# Patient Record
Sex: Male | Born: 1960 | Race: Black or African American | Hispanic: No | Marital: Married | Smoking: Never smoker
Health system: Southern US, Community
[De-identification: ages and names within clinical notes are randomized; demographics above are authoritative.]

## PROBLEM LIST (undated history)

## (undated) DIAGNOSIS — M5126 Other intervertebral disc displacement, lumbar region: Secondary | ICD-10-CM

## (undated) DIAGNOSIS — R112 Nausea with vomiting, unspecified: Secondary | ICD-10-CM

## (undated) DIAGNOSIS — E7800 Pure hypercholesterolemia, unspecified: Secondary | ICD-10-CM

## (undated) DIAGNOSIS — Z9889 Other specified postprocedural states: Secondary | ICD-10-CM

## (undated) DIAGNOSIS — K219 Gastro-esophageal reflux disease without esophagitis: Secondary | ICD-10-CM

## (undated) HISTORY — PX: COLONOSCOPY: SHX174

## (undated) HISTORY — PX: ROTATOR CUFF REPAIR: SHX139

## (undated) HISTORY — PX: BACK SURGERY: SHX140

---

## 1998-12-16 ENCOUNTER — Encounter: Payer: Self-pay | Admitting: Neurosurgery

## 1998-12-16 ENCOUNTER — Ambulatory Visit (HOSPITAL_COMMUNITY): Admission: RE | Admit: 1998-12-16 | Discharge: 1998-12-16 | Payer: Self-pay | Admitting: Neurosurgery

## 1999-09-23 ENCOUNTER — Ambulatory Visit (HOSPITAL_COMMUNITY): Admission: RE | Admit: 1999-09-23 | Discharge: 1999-09-23 | Payer: Self-pay | Admitting: Neurosurgery

## 1999-09-23 ENCOUNTER — Encounter: Payer: Self-pay | Admitting: Neurosurgery

## 2011-04-05 DIAGNOSIS — R072 Precordial pain: Secondary | ICD-10-CM

## 2016-11-28 ENCOUNTER — Other Ambulatory Visit: Payer: Self-pay | Admitting: Neurosurgery

## 2016-12-17 ENCOUNTER — Other Ambulatory Visit: Payer: Self-pay

## 2016-12-17 ENCOUNTER — Encounter (HOSPITAL_COMMUNITY): Payer: Self-pay | Admitting: *Deleted

## 2016-12-17 NOTE — Progress Notes (Signed)
Pt denies SOB, chest pain, and being under the care of a cardiologist. Pt stated that a stress test ws performed > 10 years ago. Pt denies having an echo and cardiac cath. Pt denies having a chest x ray and EKG within the last year. Pt denies recent labs. Pt made aware to stop taking Aspirin, vitamins, fish oil and herbal medications. Do not take any NSAIDs ie: Ibuprofen, Advil, Naproxen (ALeve), Motrin, BC and Goody Powder or any medication containing Aspirin. Pt verbalized understanding of all pre-op instructions.

## 2016-12-18 ENCOUNTER — Encounter (HOSPITAL_COMMUNITY)
Admission: RE | Disposition: A | Discharge status: Home / Self Care | Payer: Self-pay | Source: Ambulatory Visit | Attending: Neurosurgery

## 2016-12-18 ENCOUNTER — Encounter (HOSPITAL_COMMUNITY): Payer: Self-pay | Admitting: *Deleted

## 2016-12-18 ENCOUNTER — Inpatient Hospital Stay (HOSPITAL_COMMUNITY): Payer: Worker's Compensation

## 2016-12-18 ENCOUNTER — Inpatient Hospital Stay (HOSPITAL_COMMUNITY): Payer: Worker's Compensation | Admitting: Anesthesiology

## 2016-12-18 ENCOUNTER — Inpatient Hospital Stay (HOSPITAL_COMMUNITY)
Admission: RE | Admit: 2016-12-18 | Discharge: 2016-12-20 | DRG: 455 | Disposition: A | Discharge status: Home / Self Care | Payer: Worker's Compensation | Source: Ambulatory Visit | Attending: Neurosurgery | Admitting: Neurosurgery

## 2016-12-18 DIAGNOSIS — K219 Gastro-esophageal reflux disease without esophagitis: Secondary | ICD-10-CM | POA: Diagnosis present

## 2016-12-18 DIAGNOSIS — M5116 Intervertebral disc disorders with radiculopathy, lumbar region: Secondary | ICD-10-CM | POA: Diagnosis present

## 2016-12-18 DIAGNOSIS — E7800 Pure hypercholesterolemia, unspecified: Secondary | ICD-10-CM | POA: Diagnosis present

## 2016-12-18 DIAGNOSIS — Z79899 Other long term (current) drug therapy: Secondary | ICD-10-CM

## 2016-12-18 DIAGNOSIS — Z419 Encounter for procedure for purposes other than remedying health state, unspecified: Secondary | ICD-10-CM

## 2016-12-18 DIAGNOSIS — M5126 Other intervertebral disc displacement, lumbar region: Secondary | ICD-10-CM | POA: Diagnosis present

## 2016-12-18 HISTORY — DX: Nausea with vomiting, unspecified: Z98.890

## 2016-12-18 HISTORY — DX: Gastro-esophageal reflux disease without esophagitis: K21.9

## 2016-12-18 HISTORY — DX: Other specified postprocedural states: R11.2

## 2016-12-18 HISTORY — DX: Other intervertebral disc displacement, lumbar region: M51.26

## 2016-12-18 HISTORY — DX: Pure hypercholesterolemia, unspecified: E78.00

## 2016-12-18 LAB — CBC
HEMATOCRIT: 42 % (ref 39.0–52.0)
HEMOGLOBIN: 14.6 g/dL (ref 13.0–17.0)
MCH: 28.1 pg (ref 26.0–34.0)
MCHC: 34.8 g/dL (ref 30.0–36.0)
MCV: 80.9 fL (ref 78.0–100.0)
Platelets: 189 10*3/uL (ref 150–400)
RBC: 5.19 MIL/uL (ref 4.22–5.81)
RDW: 13 % (ref 11.5–15.5)
WBC: 3.7 10*3/uL — ABNORMAL LOW (ref 4.0–10.5)

## 2016-12-18 LAB — COMPREHENSIVE METABOLIC PANEL
ALT: 25 U/L (ref 17–63)
AST: 24 U/L (ref 15–41)
Albumin: 3.7 g/dL (ref 3.5–5.0)
Alkaline Phosphatase: 111 U/L (ref 38–126)
Anion gap: 10 (ref 5–15)
BUN: 9 mg/dL (ref 6–20)
CHLORIDE: 103 mmol/L (ref 101–111)
CO2: 24 mmol/L (ref 22–32)
CREATININE: 0.88 mg/dL (ref 0.61–1.24)
Calcium: 8.9 mg/dL (ref 8.9–10.3)
GFR calc Af Amer: >60 mL/min (ref >60)
Glucose, Bld: 116 mg/dL — ABNORMAL HIGH (ref 65–99)
Potassium: 3.5 mmol/L (ref 3.5–5.1)
SODIUM: 137 mmol/L (ref 135–145)
Total Bilirubin: 1 mg/dL (ref 0.3–1.2)
Total Protein: 7.2 g/dL (ref 6.5–8.1)

## 2016-12-18 LAB — SURGICAL PCR SCREEN
MRSA, PCR: NEGATIVE
STAPHYLOCOCCUS AUREUS: NEGATIVE

## 2016-12-18 LAB — ABO/RH: ABO/RH(D): O POS

## 2016-12-18 LAB — TYPE AND SCREEN
ABO/RH(D): O POS
Antibody Screen: NEGATIVE

## 2016-12-18 SURGERY — POSTERIOR LUMBAR FUSION 1 LEVEL
Anesthesia: General | Site: Spine Lumbar

## 2016-12-18 MED ORDER — LIDOCAINE 2% (20 MG/ML) 5 ML SYRINGE
INTRAMUSCULAR | Status: AC
  Filled 2016-12-18: qty 5

## 2016-12-18 MED ORDER — SUGAMMADEX SODIUM 200 MG/2ML IV SOLN
INTRAVENOUS | Status: DC | PRN
  Administered 2016-12-18: 200 mg via INTRAVENOUS

## 2016-12-18 MED ORDER — EPHEDRINE 5 MG/ML INJ
INTRAVENOUS | Status: AC
  Filled 2016-12-18: qty 10

## 2016-12-18 MED ORDER — BACITRACIN ZINC 500 UNIT/GM EX OINT
TOPICAL_OINTMENT | CUTANEOUS | Status: AC
  Filled 2016-12-18: qty 28.35

## 2016-12-18 MED ORDER — MIDAZOLAM HCL 5 MG/5ML IJ SOLN
INTRAMUSCULAR | Status: DC | PRN
  Administered 2016-12-18: 2 mg via INTRAVENOUS

## 2016-12-18 MED ORDER — LIDOCAINE 2% (20 MG/ML) 5 ML SYRINGE
INTRAMUSCULAR | Status: DC | PRN
  Administered 2016-12-18: 60 mg via INTRAVENOUS

## 2016-12-18 MED ORDER — VANCOMYCIN HCL 1000 MG IV SOLR
INTRAVENOUS | Status: AC
  Filled 2016-12-18: qty 1000

## 2016-12-18 MED ORDER — HEMOSTATIC AGENTS (NO CHARGE) OPTIME
TOPICAL | Status: DC | PRN
  Administered 2016-12-18: 1 via TOPICAL

## 2016-12-18 MED ORDER — MENTHOL 3 MG MT LOZG: 1.0000 | LOZENGE | OROMUCOSAL | Status: DC | PRN

## 2016-12-18 MED ORDER — ONDANSETRON HCL 4 MG/2ML IJ SOLN
INTRAMUSCULAR | Status: AC
  Filled 2016-12-18: qty 2

## 2016-12-18 MED ORDER — ONDANSETRON HCL 4 MG/2ML IJ SOLN
INTRAMUSCULAR | Status: DC | PRN
  Administered 2016-12-18: 4 mg via INTRAVENOUS

## 2016-12-18 MED ORDER — ALBUMIN HUMAN 5 % IV SOLN
INTRAVENOUS | Status: DC | PRN
  Administered 2016-12-18: 11:00:00 via INTRAVENOUS

## 2016-12-18 MED ORDER — ARTIFICIAL TEARS OPHTHALMIC OINT
TOPICAL_OINTMENT | OPHTHALMIC | Status: DC | PRN
  Administered 2016-12-18: 1 via OPHTHALMIC

## 2016-12-18 MED ORDER — ACETAMINOPHEN 325 MG PO TABS
650.0000 mg | ORAL_TABLET | ORAL | Status: DC | PRN
  Administered 2016-12-18: 650 mg via ORAL
  Filled 2016-12-18: qty 2

## 2016-12-18 MED ORDER — EPHEDRINE SULFATE 50 MG/ML IJ SOLN
INTRAMUSCULAR | Status: DC | PRN
  Administered 2016-12-18: 10 mg via INTRAVENOUS
  Administered 2016-12-18 (×3): 5 mg via INTRAVENOUS

## 2016-12-18 MED ORDER — OXYCODONE HCL 5 MG PO TABS
10.0000 mg | ORAL_TABLET | ORAL | Status: DC | PRN
  Administered 2016-12-18 (×2): 10 mg via ORAL
  Filled 2016-12-18 (×2): qty 2

## 2016-12-18 MED ORDER — DOCUSATE SODIUM 100 MG PO CAPS
100.0000 mg | ORAL_CAPSULE | Freq: Two times a day (BID) | ORAL | Status: DC
  Administered 2016-12-18 – 2016-12-20 (×4): 100 mg via ORAL
  Filled 2016-12-18 (×4): qty 1

## 2016-12-18 MED ORDER — ROCURONIUM BROMIDE 10 MG/ML (PF) SYRINGE
PREFILLED_SYRINGE | INTRAVENOUS | Status: AC
  Filled 2016-12-18: qty 5

## 2016-12-18 MED ORDER — THROMBIN (RECOMBINANT) 20000 UNITS EX SOLR
CUTANEOUS | Status: AC
  Filled 2016-12-18: qty 20000

## 2016-12-18 MED ORDER — VANCOMYCIN HCL 1000 MG IV SOLR
INTRAVENOUS | Status: DC | PRN
  Administered 2016-12-18: 1000 mg via TOPICAL

## 2016-12-18 MED ORDER — ONDANSETRON HCL 4 MG/2ML IJ SOLN: 4.0000 mg | Freq: Four times a day (QID) | INTRAMUSCULAR | Status: DC | PRN

## 2016-12-18 MED ORDER — ACETAMINOPHEN 650 MG RE SUPP: 650.0000 mg | RECTAL | Status: DC | PRN

## 2016-12-18 MED ORDER — PHENYLEPHRINE 40 MCG/ML (10ML) SYRINGE FOR IV PUSH (FOR BLOOD PRESSURE SUPPORT)
PREFILLED_SYRINGE | INTRAVENOUS | Status: AC
  Filled 2016-12-18: qty 10

## 2016-12-18 MED ORDER — CYCLOBENZAPRINE HCL 10 MG PO TABS
10.0000 mg | ORAL_TABLET | Freq: Three times a day (TID) | ORAL | Status: DC | PRN
  Administered 2016-12-18 – 2016-12-20 (×2): 10 mg via ORAL
  Filled 2016-12-18 (×2): qty 1

## 2016-12-18 MED ORDER — 0.9 % SODIUM CHLORIDE (POUR BTL) OPTIME
TOPICAL | Status: DC | PRN
  Administered 2016-12-18: 1000 mL

## 2016-12-18 MED ORDER — MORPHINE SULFATE (PF) 4 MG/ML IV SOLN
4.0000 mg | INTRAVENOUS | Status: DC | PRN
  Administered 2016-12-18: 4 mg via INTRAVENOUS
  Filled 2016-12-18: qty 1

## 2016-12-18 MED ORDER — BUPIVACAINE LIPOSOME 1.3 % IJ SUSP
20.0000 mL | Freq: Once | INTRAMUSCULAR | Status: AC
  Administered 2016-12-18: 20 mL
  Filled 2016-12-18: qty 20

## 2016-12-18 MED ORDER — BACITRACIN ZINC 500 UNIT/GM EX OINT
TOPICAL_OINTMENT | CUTANEOUS | Status: DC | PRN
  Administered 2016-12-18: 1 via TOPICAL

## 2016-12-18 MED ORDER — CHLORHEXIDINE GLUCONATE CLOTH 2 % EX PADS: 6.0000 | MEDICATED_PAD | Freq: Once | CUTANEOUS | Status: DC

## 2016-12-18 MED ORDER — BUPIVACAINE-EPINEPHRINE (PF) 0.5% -1:200000 IJ SOLN
INTRAMUSCULAR | Status: DC | PRN
  Administered 2016-12-18: 10 mL

## 2016-12-18 MED ORDER — DEXAMETHASONE SODIUM PHOSPHATE 4 MG/ML IJ SOLN
INTRAMUSCULAR | Status: DC | PRN
  Administered 2016-12-18: 10 mg via INTRAVENOUS

## 2016-12-18 MED ORDER — SODIUM CHLORIDE 0.9 % IV SOLN: INTRAVENOUS | Status: DC

## 2016-12-18 MED ORDER — PROPOFOL 10 MG/ML IV BOLUS
INTRAVENOUS | Status: AC
  Filled 2016-12-18: qty 20

## 2016-12-18 MED ORDER — BISACODYL 10 MG RE SUPP
10.0000 mg | Freq: Every day | RECTAL | Status: DC | PRN
Start: 2016-12-18 — End: 2016-12-20

## 2016-12-18 MED ORDER — ONDANSETRON HCL 4 MG PO TABS: 4.0000 mg | ORAL_TABLET | Freq: Four times a day (QID) | ORAL | Status: DC | PRN

## 2016-12-18 MED ORDER — FENTANYL CITRATE (PF) 250 MCG/5ML IJ SOLN
INTRAMUSCULAR | Status: AC
  Filled 2016-12-18: qty 5

## 2016-12-18 MED ORDER — DEXAMETHASONE SODIUM PHOSPHATE 10 MG/ML IJ SOLN
INTRAMUSCULAR | Status: AC
  Filled 2016-12-18: qty 1

## 2016-12-18 MED ORDER — OXYCODONE HCL 5 MG/5ML PO SOLN: 5.0000 mg | Freq: Once | ORAL | Status: DC | PRN

## 2016-12-18 MED ORDER — SODIUM CHLORIDE 0.9% FLUSH: 3.0000 mL | INTRAVENOUS | Status: DC | PRN

## 2016-12-18 MED ORDER — POLYVINYL ALCOHOL 1.4 % OP SOLN
1.0000 [drp] | OPHTHALMIC | Status: DC | PRN
  Filled 2016-12-18: qty 15

## 2016-12-18 MED ORDER — EZETIMIBE 10 MG PO TABS
10.0000 mg | ORAL_TABLET | Freq: Every day | ORAL | Status: DC
  Administered 2016-12-19 – 2016-12-20 (×2): 10 mg via ORAL
  Filled 2016-12-18 (×3): qty 1

## 2016-12-18 MED ORDER — HYDROMORPHONE HCL 1 MG/ML IJ SOLN
0.2500 mg | INTRAMUSCULAR | Status: DC | PRN
  Administered 2016-12-18: 0.5 mg via INTRAVENOUS

## 2016-12-18 MED ORDER — ARTIFICIAL TEARS OPHTHALMIC OINT
TOPICAL_OINTMENT | OPHTHALMIC | Status: AC
  Filled 2016-12-18: qty 3.5

## 2016-12-18 MED ORDER — THROMBIN (RECOMBINANT) 5000 UNITS EX SOLR
CUTANEOUS | Status: AC
  Filled 2016-12-18: qty 5000

## 2016-12-18 MED ORDER — OXYCODONE HCL 5 MG PO TABS: 5.0000 mg | ORAL_TABLET | ORAL | Status: DC | PRN

## 2016-12-18 MED ORDER — PROPOFOL 10 MG/ML IV BOLUS
INTRAVENOUS | Status: DC | PRN
  Administered 2016-12-18: 150 mg via INTRAVENOUS

## 2016-12-18 MED ORDER — MIDAZOLAM HCL 2 MG/2ML IJ SOLN
INTRAMUSCULAR | Status: AC
  Filled 2016-12-18: qty 2

## 2016-12-18 MED ORDER — ROCURONIUM BROMIDE 10 MG/ML (PF) SYRINGE
PREFILLED_SYRINGE | INTRAVENOUS | Status: DC | PRN
  Administered 2016-12-18: 10 mg via INTRAVENOUS
  Administered 2016-12-18: 30 mg via INTRAVENOUS
  Administered 2016-12-18: 50 mg via INTRAVENOUS
  Administered 2016-12-18: 20 mg via INTRAVENOUS

## 2016-12-18 MED ORDER — PHENYLEPHRINE HCL 10 MG/ML IJ SOLN
INTRAVENOUS | Status: DC | PRN
  Administered 2016-12-18: 15 ug/min via INTRAVENOUS

## 2016-12-18 MED ORDER — LACTATED RINGERS IV SOLN
INTRAVENOUS | Status: DC | PRN
  Administered 2016-12-18 (×2): via INTRAVENOUS

## 2016-12-18 MED ORDER — PHENOL 1.4 % MT LIQD: 1.0000 | OROMUCOSAL | Status: DC | PRN

## 2016-12-18 MED ORDER — THROMBIN (RECOMBINANT) 5000 UNITS EX SOLR
CUTANEOUS | Status: DC | PRN
  Administered 2016-12-18: 5000 [IU] via TOPICAL

## 2016-12-18 MED ORDER — FENTANYL CITRATE (PF) 100 MCG/2ML IJ SOLN
INTRAMUSCULAR | Status: DC | PRN
  Administered 2016-12-18: 25 ug via INTRAVENOUS
  Administered 2016-12-18: 50 ug via INTRAVENOUS
  Administered 2016-12-18 (×3): 25 ug via INTRAVENOUS
  Administered 2016-12-18: 150 ug via INTRAVENOUS

## 2016-12-18 MED ORDER — SODIUM CHLORIDE 0.9% FLUSH: 3.0000 mL | Freq: Two times a day (BID) | INTRAVENOUS | Status: DC

## 2016-12-18 MED ORDER — BUPIVACAINE-EPINEPHRINE (PF) 0.5% -1:200000 IJ SOLN
INTRAMUSCULAR | Status: AC
  Filled 2016-12-18: qty 30

## 2016-12-18 MED ORDER — OXYCODONE HCL 5 MG PO TABS: 5.0000 mg | ORAL_TABLET | Freq: Once | ORAL | Status: DC | PRN

## 2016-12-18 MED ORDER — CEFAZOLIN SODIUM-DEXTROSE 2-4 GM/100ML-% IV SOLN
2.0000 g | Freq: Three times a day (TID) | INTRAVENOUS | Status: AC
  Administered 2016-12-18 (×2): 2 g via INTRAVENOUS
  Filled 2016-12-18 (×2): qty 100

## 2016-12-18 MED ORDER — HYDROMORPHONE HCL 1 MG/ML IJ SOLN
INTRAMUSCULAR | Status: AC
  Filled 2016-12-18: qty 1

## 2016-12-18 MED ORDER — BACITRACIN 50000 UNITS IM SOLR
INTRAMUSCULAR | Status: DC | PRN
  Administered 2016-12-18: 500 mL

## 2016-12-18 MED ORDER — SUGAMMADEX SODIUM 200 MG/2ML IV SOLN
INTRAVENOUS | Status: AC
  Filled 2016-12-18: qty 2

## 2016-12-18 MED ORDER — FAMOTIDINE 20 MG PO TABS
20.0000 mg | ORAL_TABLET | Freq: Two times a day (BID) | ORAL | Status: DC
  Administered 2016-12-18 – 2016-12-20 (×4): 20 mg via ORAL
  Filled 2016-12-18 (×4): qty 1

## 2016-12-18 MED ORDER — CEFAZOLIN SODIUM-DEXTROSE 2-4 GM/100ML-% IV SOLN
2.0000 g | INTRAVENOUS | Status: AC
  Administered 2016-12-18: 2 g via INTRAVENOUS
  Filled 2016-12-18: qty 100

## 2016-12-18 MED ORDER — HYDROXYZINE HCL 50 MG/ML IM SOLN
50.0000 mg | Freq: Four times a day (QID) | INTRAMUSCULAR | Status: DC | PRN
  Administered 2016-12-18 – 2016-12-20 (×3): 50 mg via INTRAMUSCULAR
  Filled 2016-12-18 (×3): qty 1

## 2016-12-18 MED ORDER — PHENYLEPHRINE 40 MCG/ML (10ML) SYRINGE FOR IV PUSH (FOR BLOOD PRESSURE SUPPORT)
PREFILLED_SYRINGE | INTRAVENOUS | Status: DC | PRN
  Administered 2016-12-18 (×3): 80 ug via INTRAVENOUS
  Administered 2016-12-18 (×2): 40 ug via INTRAVENOUS
  Administered 2016-12-18: 80 ug via INTRAVENOUS
  Administered 2016-12-18: 40 ug via INTRAVENOUS

## 2016-12-18 SURGICAL SUPPLY — 73 items
APL SKNCLS STERI-STRIP NONHPOA (GAUZE/BANDAGES/DRESSINGS) ×1
BAG DECANTER FOR FLEXI CONT (MISCELLANEOUS) ×3 IMPLANT
BENZOIN TINCTURE PRP APPL 2/3 (GAUZE/BANDAGES/DRESSINGS) ×3 IMPLANT
BLADE CLIPPER SURG (BLADE) ×2 IMPLANT
BUR MATCHSTICK NEURO 3.0 LAGG (BURR) ×3 IMPLANT
BUR PRECISION FLUTE 6.0 (BURR) ×3 IMPLANT
CAGE ALTERA 10X31MM-10-14-15 (Cage) ×1 IMPLANT
CAGE ALTERA 10X31X10-14 15D (Cage) ×1 IMPLANT
CANISTER SUCT 3000ML PPV (MISCELLANEOUS) ×3 IMPLANT
CAP REVERE LOCKING (Cap) ×8 IMPLANT
CARTRIDGE OIL MAESTRO DRILL (MISCELLANEOUS) ×1 IMPLANT
CLOSURE WOUND 1/2 X4 (GAUZE/BANDAGES/DRESSINGS) ×1
CONT SPEC 4OZ CLIKSEAL STRL BL (MISCELLANEOUS) ×3 IMPLANT
COVER BACK TABLE 60X90IN (DRAPES) ×4 IMPLANT
DIFFUSER DRILL AIR PNEUMATIC (MISCELLANEOUS) ×3 IMPLANT
DRAPE C-ARM 42X72 X-RAY (DRAPES) ×6 IMPLANT
DRAPE HALF SHEET 40X57 (DRAPES) ×3 IMPLANT
DRAPE LAPAROTOMY 100X72X124 (DRAPES) ×3 IMPLANT
DRAPE POUCH INSTRU U-SHP 10X18 (DRAPES) ×3 IMPLANT
DRAPE SHEET LG 3/4 BI-LAMINATE (DRAPES) ×2 IMPLANT
DRAPE SURG 17X23 STRL (DRAPES) ×12 IMPLANT
ELECT BLADE 4.0 EZ CLEAN MEGAD (MISCELLANEOUS) ×3
ELECT REM PT RETURN 9FT ADLT (ELECTROSURGICAL) ×3
ELECTRODE BLDE 4.0 EZ CLN MEGD (MISCELLANEOUS) ×1 IMPLANT
ELECTRODE REM PT RTRN 9FT ADLT (ELECTROSURGICAL) ×1 IMPLANT
EVACUATOR 1/8 PVC DRAIN (DRAIN) IMPLANT
GAUZE SPONGE 4X4 12PLY STRL (GAUZE/BANDAGES/DRESSINGS) ×1 IMPLANT
GAUZE SPONGE 4X4 12PLY STRL LF (GAUZE/BANDAGES/DRESSINGS) ×2 IMPLANT
GAUZE SPONGE 4X4 16PLY XRAY LF (GAUZE/BANDAGES/DRESSINGS) ×1 IMPLANT
GLOVE BIO SURGEON STRL SZ7 (GLOVE) ×6 IMPLANT
GLOVE BIO SURGEON STRL SZ8 (GLOVE) ×8 IMPLANT
GLOVE BIO SURGEON STRL SZ8.5 (GLOVE) ×6 IMPLANT
GLOVE BIOGEL PI IND STRL 6.5 (GLOVE) IMPLANT
GLOVE BIOGEL PI IND STRL 7.0 (GLOVE) IMPLANT
GLOVE BIOGEL PI IND STRL 7.5 (GLOVE) IMPLANT
GLOVE BIOGEL PI INDICATOR 6.5 (GLOVE) ×6
GLOVE BIOGEL PI INDICATOR 7.0 (GLOVE) ×4
GLOVE BIOGEL PI INDICATOR 7.5 (GLOVE) ×2
GLOVE EXAM NITRILE LRG STRL (GLOVE) IMPLANT
GLOVE EXAM NITRILE XL STR (GLOVE) IMPLANT
GLOVE EXAM NITRILE XS STR PU (GLOVE) IMPLANT
GLOVE SURG SS PI 6.5 STRL IVOR (GLOVE) ×8 IMPLANT
GOWN STRL REUS W/ TWL LRG LVL3 (GOWN DISPOSABLE) IMPLANT
GOWN STRL REUS W/ TWL XL LVL3 (GOWN DISPOSABLE) ×2 IMPLANT
GOWN STRL REUS W/TWL 2XL LVL3 (GOWN DISPOSABLE) IMPLANT
GOWN STRL REUS W/TWL LRG LVL3 (GOWN DISPOSABLE) ×12
GOWN STRL REUS W/TWL XL LVL3 (GOWN DISPOSABLE) ×9
KIT BASIN OR (CUSTOM PROCEDURE TRAY) ×3 IMPLANT
KIT ROOM TURNOVER OR (KITS) ×3 IMPLANT
NDL HYPO 21X1.5 SAFETY (NEEDLE) IMPLANT
NEEDLE HYPO 21X1.5 SAFETY (NEEDLE) ×3 IMPLANT
NEEDLE HYPO 22GX1.5 SAFETY (NEEDLE) ×3 IMPLANT
NS IRRIG 1000ML POUR BTL (IV SOLUTION) ×3 IMPLANT
OIL CARTRIDGE MAESTRO DRILL (MISCELLANEOUS) ×3
PACK LAMINECTOMY NEURO (CUSTOM PROCEDURE TRAY) ×3 IMPLANT
PAD ARMBOARD 7.5X6 YLW CONV (MISCELLANEOUS) ×13 IMPLANT
PATTIES SURGICAL .5 X1 (DISPOSABLE) IMPLANT
ROD REVERE 6.35 40MM (Rod) ×4 IMPLANT
SCREW REVERE 6.35 6.5MMX45 (Screw) ×8 IMPLANT
SPONGE LAP 4X18 X RAY DECT (DISPOSABLE) IMPLANT
SPONGE NEURO XRAY DETECT 1X3 (DISPOSABLE) IMPLANT
SPONGE SURGIFOAM ABS GEL 100 (HEMOSTASIS) ×1 IMPLANT
STRIP BIOACTIVE 20CC 25X100X8 (Miscellaneous) ×2 IMPLANT
STRIP CLOSURE SKIN 1/2X4 (GAUZE/BANDAGES/DRESSINGS) ×2 IMPLANT
SUT PROLENE 6 0 BV (SUTURE) ×2 IMPLANT
SUT VIC AB 1 CT1 18XBRD ANBCTR (SUTURE) ×2 IMPLANT
SUT VIC AB 1 CT1 8-18 (SUTURE) ×6
SUT VIC AB 2-0 CP2 18 (SUTURE) ×6 IMPLANT
TAPE CLOTH SURG 4X10 WHT LF (GAUZE/BANDAGES/DRESSINGS) ×2 IMPLANT
TOWEL GREEN STERILE (TOWEL DISPOSABLE) ×3 IMPLANT
TOWEL GREEN STERILE FF (TOWEL DISPOSABLE) ×3 IMPLANT
TRAY FOLEY W/METER SILVER 16FR (SET/KITS/TRAYS/PACK) ×3 IMPLANT
WATER STERILE IRR 1000ML POUR (IV SOLUTION) ×3 IMPLANT

## 2016-12-18 NOTE — Transfer of Care (Signed)
Immediate Anesthesia Transfer of Care Note  Patient: Gregory Maxwell.  Procedure(s) Performed: POSTERIOR LUMBAR INTERBODY FUSION, INTERBODY PROSTHESIS, POSTERIOR INSTRUMENTATION LUMBAR FOUR- LUMBAR FIVE (N/A Spine Lumbar)  Patient Location: PACU  Anesthesia Type:General  Level of Consciousness: awake, alert  and oriented  Airway & Oxygen Therapy: Patient Spontanous Breathing and Patient connected to face mask oxygen  Post-op Assessment: Report given to RN, Post -op Vital signs reviewed and stable and Patient moving all extremities X 4  Post vital signs: Reviewed and stable  Last Vitals:  Vitals:   12/18/16 0643  BP: 137/84  Pulse: 73  Resp: 20  Temp: 36.9 C  SpO2: 98%    Last Pain:  Vitals:   12/18/16 0643  TempSrc: Oral      Patients Stated Pain Goal: 4 (13/68/59 9234)  Complications: No apparent anesthesia complications

## 2016-12-18 NOTE — Anesthesia Procedure Notes (Addendum)
Procedure Name: Intubation Date/Time: 12/18/2016 8:40 AM Performed by: Harden Mo, CRNA Pre-anesthesia Checklist: Patient identified, Emergency Drugs available, Suction available, Patient being monitored and Timeout performed Patient Re-evaluated:Patient Re-evaluated prior to induction Oxygen Delivery Method: Circle system utilized Preoxygenation: Pre-oxygenation with 100% oxygen Induction Type: IV induction Ventilation: Mask ventilation without difficulty Laryngoscope Size: Mac and 4 Grade View: Grade II Tube type: Oral Tube size: 7.5 mm Number of attempts: 1 Airway Equipment and Method: Stylet Placement Confirmation: ETT inserted through vocal cords under direct vision,  positive ETCO2 and breath sounds checked- equal and bilateral Secured at: 22 cm Tube secured with: Tape Dental Injury: Teeth and Oropharynx as per pre-operative assessment  Comments: Intubation by Chrissie Noa

## 2016-12-18 NOTE — Anesthesia Preprocedure Evaluation (Addendum)
Anesthesia Evaluation  Patient identified by MRN, date of birth, ID band Patient awake    Reviewed: Allergy & Precautions, H&P , NPO status , Patient's Chart, lab work & pertinent test results  History of Anesthesia Complications (+) PONV and history of anesthetic complications  Airway Mallampati: II  TM Distance: >3 FB Neck ROM: full    Dental  (+) Teeth Intact, Dental Advisory Given   Pulmonary neg pulmonary ROS,    breath sounds clear to auscultation       Cardiovascular negative cardio ROS   Rhythm:regular Rate:Normal     Neuro/Psych    GI/Hepatic GERD  ,  Endo/Other    Renal/GU      Musculoskeletal   Abdominal   Peds  Hematology   Anesthesia Other Findings   Reproductive/Obstetrics                            Anesthesia Physical Anesthesia Plan  ASA: II  Anesthesia Plan: General   Post-op Pain Management:    Induction: Intravenous  PONV Risk Score and Plan: 2 and Treatment may vary due to age or medical condition, Ondansetron, Dexamethasone and Midazolam  Airway Management Planned: Oral ETT  Additional Equipment:   Intra-op Plan:   Post-operative Plan: Extubation in OR  Informed Consent: I have reviewed the patients History and Physical, chart, labs and discussed the procedure including the risks, benefits and alternatives for the proposed anesthesia with the patient or authorized representative who has indicated his/her understanding and acceptance.     Plan Discussed with: CRNA, Anesthesiologist and Surgeon  Anesthesia Plan Comments:         Anesthesia Quick Evaluation

## 2016-12-18 NOTE — Anesthesia Procedure Notes (Deleted)
Performed by: Harden Mo, CRNA

## 2016-12-18 NOTE — Op Note (Signed)
Brief history: The patient is a 56 year old black male who has had prior back surgeries.  He has had a previous L4-5 discectomy on the right after work-related injury.  He initially did well but has developed recurrent back and leg pain.  He failed medical management and was worked up with a lumbar MRI.  This demonstrated a recurrent herniated disc at L4-5.  I discussed the various treatment option with the patient and his wife including surgery.  He has weighed the risks, benefits, and alternatives of surgery and decided proceed with a L4-5 decompression, instrumentation, and fusion.  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, degenerative disc disease, lumbago; lumbar radiculopathy  Postoperative diagnosis: The same  Procedure: Bilateral L4-5 laminotomy/foraminotomies to decompress the bilateral L4 and L5 nerve roots(the work required to do this was in addition to the work required to do the posterior lumbar interbody fusion because of the patient's recurrent ruptured disc); L4-5 transforaminal lumbar interbody fusion with local morselized autograft bone and Kinnex graft extender; insertion of interbody prosthesis at L4-5 (globus peek expandable interbody prosthesis); posterior nonsegmental instrumentation from L4 to L5 with globus titanium pedicle screws and rods; posterior lateral arthrodesis at L4-5 with local morselized autograft bone and Kinnex bone graft extender.  Surgeon: Dr. Earle Gell  Asst.: Dr. Sherley Bounds  Anesthesia: Gen. endotracheal  Estimated blood loss: 200 cc  Drains: None  Complications: None  Description of procedure: The patient was brought to the operating room by the anesthesia team. General endotracheal anesthesia was induced. The patient was turned to the prone position on the Creary frame. The patient's lumbosacral region was then prepared with Betadine scrub and Betadine solution. Sterile drapes were applied.  I then injected the area to be incised  with Marcaine with epinephrine solution. I then used the scalpel to make a linear midline incision over the L4-5 interspace, incising through the old surgical scar. I then used electrocautery to perform a bilateral subperiosteal dissection exposing the spinous process and lamina of L4 and L5 bilaterally. We then obtained intraoperative radiograph to confirm our location. We then inserted the Verstrac retractor to provide exposure.  I began the decompression by using the high speed drill to perform laminotomies at L4-5 bilaterally. We then used the Kerrison punches to widen the laminotomy and removed the ligamentum flavum at L4-5 on the left and to remove the scar tissue at L4-5 on the right.. We used the Kerrison punches to remove the medial facets at L4-5 bilaterally. We performed wide foraminotomies about the bilateral L4 and L5 nerve roots completing the decompression.  We now turned our attention to the posterior lumbar interbody fusion. I used a scalpel to incise the intervertebral disc at L4-5 bilaterally. I then performed a partial intervertebral discectomy at L4-5 bilaterally using the pituitary forceps. We prepared the vertebral endplates at A1-2 bilaterally for the fusion by removing the soft tissues with the curettes. We then used the trial spacers to pick the appropriate sized interbody prosthesis. We prefilled his prosthesis with a combination of local morselized autograft bone that we obtained during the decompression as well as Kinnex bone graft extender. We inserted the prefilled prosthesis into the interspace at L4-5, we then expanded the prosthesis. There was a good snug fit of the prosthesis in the interspace. We then filled and the remainder of the intervertebral disc space with local morselized autograft bone and Kinnex. This completed the posterior lumbar interbody arthrodesis.  We now turned attention to the instrumentation. Under fluoroscopic guidance we cannulated the  bilateral L4 and  L5 pedicles with the bone probe. We then removed the bone probe. We then tapped the pedicle with a 5.5 millimeter tap. We then removed the tap. We probed inside the tapped pedicle with a ball probe to rule out cortical breaches. We then inserted a 6.5 x 45 millimeter pedicle screw into the L4 and L5 pedicles bilaterally under fluoroscopic guidance. We then palpated along the medial aspect of the pedicles to rule out cortical breaches. There were none. The nerve roots were not injured. We then connected the unilateral pedicle screws with a lordotic rod. We compressed the construct and secured the rod in place with the caps. We then tightened the caps appropriately. This completed the instrumentation from L4-5.  We now turned our attention to the posterior lateral arthrodesis at L4-5 bilaterally. We used the high-speed drill to decorticate the remainder of the facets, pars, transverse process at L4-5 bilaterally. We then applied a combination of local morselized autograft bone and Kinnex bone graft extender over these decorticated posterior lateral structures. This completed the posterior lateral arthrodesis.  We then obtained hemostasis using bipolar electrocautery. We irrigated the wound out with bacitracin solution. We inspected the thecal sac and nerve roots and noted they were well decompressed. We then removed the retractor. We placed vancomycin powder in the wound. We reapproximated patient's thoracolumbar fascia with interrupted #1 Vicryl suture. We reapproximated patient's subcutaneous tissue with interrupted 2-0 Vicryl suture. The reapproximated patient's skin with Steri-Strips and benzoin. The wound was then coated with bacitracin ointment. A sterile dressing was applied. The drapes were removed. The patient was subsequently returned to the supine position where they were extubated by the anesthesia team. He was then transported to the post anesthesia care unit in stable condition. All sponge instrument  and needle counts were reportedly correct at the end of this case.

## 2016-12-18 NOTE — H&P (Signed)
Subjective: The patient is a 56 year old black male on whom I performed an L4-5 discectomy.  He initially did well but developed recurrent back and leg pain.  A follow-up lumbar MRI demonstrated a degenerated and recurrent herniated disc at L4-5.  I discussed the various treatment options with the patient.  He has decided to proceed with surgery.  Past Medical History:  Diagnosis Date  . Disc displacement, lumbar   . GERD (gastroesophageal reflux disease)   . Hypercholesterolemia   . PONV (postoperative nausea and vomiting)    problems waking up; problems voiding post-op    Past Surgical History:  Procedure Laterality Date  . BACK SURGERY     total of 2 back surgeries  . COLONOSCOPY    . ROTATOR CUFF REPAIR     right shoulder    No Known Allergies  Social History   Tobacco Use  . Smoking status: Never Smoker  . Smokeless tobacco: Never Used  Substance Use Topics  . Alcohol use: No    Frequency: Never    Family History  Problem Relation Age of Onset  . Hypercholesterolemia Mother   . Congestive Heart Failure Mother   . Renal Disease Sister    Prior to Admission medications   Medication Sig Start Date End Date Taking? Authorizing Provider  amoxicillin (AMOXIL) 875 MG tablet Take 875 mg by mouth 2 (two) times daily.   Yes [provider]  ezetimibe (ZETIA) 10 MG tablet Take 10 mg by mouth daily.   Yes [provider]  ranitidine (ZANTAC) 150 MG tablet Take 150 mg by mouth daily as needed for heartburn.   Yes [provider]     Review of Systems  Positive ROS: As above  All other systems have been reviewed and were otherwise negative with the exception of those mentioned in the HPI and as above.  Objective: Vital signs in last 24 hours: Temp:  [98.4 F (36.9 C)] 98.4 F (36.9 C) (12/05 0643) Pulse Rate:  [73] 73 (12/05 0643) Resp:  [20] 20 (12/05 0643) BP: (137)/(84) 137/84 (12/05 0643) SpO2:  [98 %] 98 % (12/05 0643) Weight:  [80.3  kg (177 lb)] 80.3 kg (177 lb) (12/05 0643)  General Appearance: Alert Head: Normocephalic, without obvious abnormality, atraumatic Eyes: PERRL, conjunctiva/corneas clear, EOM's intact,    Ears: Normal  Throat: Normal  Neck: Supple, his lumbar incision is well-healed. Back: unremarkable Lungs: Clear to auscultation bilaterally, respirations unlabored Heart: Regular rate and rhythm, no murmur, rub or gallop Abdomen: Soft, non-tender Extremities: Extremities normal, atraumatic, no cyanosis or edema Skin: unremarkable  NEUROLOGIC:   Mental status: alert and oriented,Motor Exam - grossly normal Sensory Exam - grossly normal Reflexes:  Coordination - grossly normal Gait - grossly normal Balance - grossly normal Cranial Nerves: I: smell Not tested  II: visual acuity  OS: Normal  OD: Normal   II: visual fields Full to confrontation  II: pupils Equal, round, reactive to light  III,VII: ptosis None  III,IV,VI: extraocular muscles  Full ROM  V: mastication Normal  V: facial light touch sensation  Normal  V,VII: corneal reflex  Present  VII: facial muscle function - upper  Normal  VII: facial muscle function - lower Normal  VIII: hearing Not tested  IX: soft palate elevation  Normal  IX,X: gag reflex Present  XI: trapezius strength  5/5  XI: sternocleidomastoid strength 5/5  XI: neck flexion strength  5/5  XII: tongue strength  Normal    Data Review Lab Results  Component Value Date   WBC 3.7 (L) 12/18/2016   HGB 14.6 12/18/2016   HCT 42.0 12/18/2016   MCV 80.9 12/18/2016   PLT 189 12/18/2016   Lab Results  Component Value Date   NA 137 12/18/2016   K 3.5 12/18/2016   CL 103 12/18/2016   CO2 24 12/18/2016   BUN 9 12/18/2016   CREATININE 0.88 12/18/2016   GLUCOSE 116 (H) 12/18/2016   No results found for: INR, PROTIME  Assessment/Plan: L4-5 degenerative disc disease, recurrent disc, lumbago, lumbar radiculopathy: I have discussed the situation with the patient.  I  have reviewed his imaging studies with him and pointed out the abnormalities.  We have discussed the various treatment options including surgery.  I have described the surgical treatment option of a L4-5 decompression, instrumentation, and fusion.  I have given him a surgical pamphlet.  I have shown him surgical models.  We have discussed the risks, benefits, alternatives, expected postoperative course, and likelihood of achieving our goals with surgery.  I have answered all the patient's questions.  He has decided proceed with surgery.   Ophelia Charter 12/18/2016 8:27 AM

## 2016-12-18 NOTE — Evaluation (Signed)
Physical Therapy Evaluation Patient Details Name: Gregory Maxwell. MRN: [id number] DOB: 1960/05/23 Today's Date: 12/18/2016   History of Present Illness  Pt is a 56 y/o male s/p L4-5 PLIF. PMH includes back surgery and R rotator cuff repair.   Clinical Impression  Pt is s/p surgery above with deficits below. PTA, pt was independent with mobility. Upon eval, pt presenting with post op pain, weakness, and nausea/dizziness. Ambulation distance limited secondary to nausea this session. Required min guard to min A for mobility this session. Reports wife will be able to assist as needed upon d/c home. Anticipate pt will progress well once feeling better and will not need follow up PT services. Will continue to follow acutely to maximize functional mobility independence and safety.     Follow Up Recommendations No PT follow up;Supervision for mobility/OOB    Equipment Recommendations  3in1 (PT)    Recommendations for Other Services       Precautions / Restrictions Precautions Precautions: Back Precaution Booklet Issued: Yes (comment) Precaution Comments: Reviewed back precaution handout with pt.  Required Braces or Orthoses: Spinal Brace Spinal Brace: Lumbar corset;Applied in sitting position Restrictions Weight Bearing Restrictions: No      Mobility  Bed Mobility Overal bed mobility: Needs Assistance Bed Mobility: Rolling;Sidelying to Sit Rolling: Supervision Sidelying to sit: Supervision       General bed mobility comments: Supervision to ensure log roll technique. Cues for sequencing  Transfers Overall transfer level: Needs assistance Equipment used: None Transfers: Sit to/from Stand Sit to Stand: Min assist         General transfer comment: Min A for lift assist and steadying. Verbal cues to power through LEs.   Ambulation/Gait Ambulation/Gait assistance: Min guard Ambulation Distance (Feet): 50 Feet Assistive device: (IV pole ) Gait Pattern/deviations:  Step-through pattern;Decreased stride length Gait velocity: Decreased Gait velocity interpretation: Below normal speed for age/gender General Gait Details: Slow, guarded gait. Pt experiencing nausea during gait which limited ambulation distance. Pt also reporting some dizziness. Reports improvement of symptoms with seated rest. Educated about generalized walking program to perform at home.   Stairs            Wheelchair Mobility    Modified Rankin (Stroke Patients Only)       Balance Overall balance assessment: Needs assistance Sitting-balance support: No upper extremity supported;Feet supported Sitting balance-Leahy Scale: Good     Standing balance support: Single extremity supported;During functional activity Standing balance-Leahy Scale: Fair Standing balance comment: Able to maintain static standing without UE support.                              Pertinent Vitals/Pain Pain Assessment: Faces Faces Pain Scale: Hurts even more Pain Location: back  Pain Descriptors / Indicators: Aching;Operative site guarding Pain Intervention(s): Monitored during session;Limited activity within patient's tolerance;Repositioned    Home Living Family/patient expects to be discharged to:: Private residence Living Arrangements: Spouse/significant other Available Help at Discharge: Family;Available 24 hours/day Type of Home: House Home Access: Level entry     Home Layout: Multi-level Home Equipment: Walker - 2 wheels      Prior Function Level of Independence: Independent               Hand Dominance        Extremity/Trunk Assessment   Upper Extremity Assessment Upper Extremity Assessment: Overall WFL for tasks assessed    Lower Extremity Assessment Lower Extremity Assessment: Generalized weakness  Cervical / Trunk Assessment Cervical / Trunk Assessment: Other exceptions Cervical / Trunk Exceptions: s/p PLIF  Communication   Communication: No  difficulties  Cognition Arousal/Alertness: Awake/alert Behavior During Therapy: WFL for tasks assessed/performed Overall Cognitive Status: Within Functional Limits for tasks assessed                                        General Comments General comments (skin integrity, edema, etc.): Pt's wife present during session.     Exercises     Assessment/Plan    PT Assessment Patient needs continued PT services  PT Problem List Decreased strength;Decreased balance;Decreased activity tolerance;Decreased mobility;Decreased knowledge of use of DME;Decreased knowledge of precautions;Pain       PT Treatment Interventions Gait training;Stair training;Functional mobility training;Therapeutic activities;Therapeutic exercise;Neuromuscular re-education;Balance training;Patient/family education    PT Goals (Current goals can be found in the Care Plan section)  Acute Rehab PT Goals Patient Stated Goal: to feel better  PT Goal Formulation: With patient Time For Goal Achievement: 12/25/16 Potential to Achieve Goals: Good    Frequency Min 5X/week   Barriers to discharge        Co-evaluation               AM-PAC PT "6 Clicks" Daily Activity  Outcome Measure Difficulty turning over in bed (including adjusting bedclothes, sheets and blankets)?: A Little Difficulty moving from lying on back to sitting on the side of the bed? : A Little Difficulty sitting down on and standing up from a chair with arms (e.g., wheelchair, bedside commode, etc,.)?: Unable Help needed moving to and from a bed to chair (including a wheelchair)?: A Little Help needed walking in hospital room?: A Little Help needed climbing 3-5 steps with a railing? : A Lot 6 Click Score: 15    End of Session Equipment Utilized During Treatment: Gait belt;Back brace Activity Tolerance: Treatment limited secondary to medical complications (Comment)(nausea/dizziness ) Patient left: in bed;with call bell/phone  within reach;with family/visitor present Nurse Communication: Mobility status PT Visit Diagnosis: Other abnormalities of gait and mobility (R26.89);Pain Pain - part of body: (back )    Time: 2979-8921 PT Time Calculation (min) (ACUTE ONLY): 23 min   Charges:   PT Evaluation $PT Eval Low Complexity: 1 Low PT Treatments $Gait Training: 8-22 mins   PT G Codes:        Leighton Ruff, PT, DPT  Acute Rehabilitation Services  Pager: [phone]   Rudean Hitt 12/18/2016, 5:29 PM

## 2016-12-19 LAB — CBC
HCT: 34.7 % — ABNORMAL LOW (ref 39.0–52.0)
Hemoglobin: 11.5 g/dL — ABNORMAL LOW (ref 13.0–17.0)
MCH: 27.3 pg (ref 26.0–34.0)
MCHC: 33.1 g/dL (ref 30.0–36.0)
MCV: 82.4 fL (ref 78.0–100.0)
Platelets: 187 10*3/uL (ref 150–400)
RBC: 4.21 MIL/uL — ABNORMAL LOW (ref 4.22–5.81)
RDW: 13.5 % (ref 11.5–15.5)
WBC: 9.3 10*3/uL (ref 4.0–10.5)

## 2016-12-19 LAB — BASIC METABOLIC PANEL
Anion gap: 7 (ref 5–15)
BUN: 11 mg/dL (ref 6–20)
CO2: 27 mmol/L (ref 22–32)
Calcium: 8.6 mg/dL — ABNORMAL LOW (ref 8.9–10.3)
Chloride: 103 mmol/L (ref 101–111)
Creatinine, Ser: 1.03 mg/dL (ref 0.61–1.24)
GFR calc non Af Amer: >60 mL/min (ref >60)
GLUCOSE: 150 mg/dL — AB (ref 65–99)
Potassium: 3.9 mmol/L (ref 3.5–5.1)
SODIUM: 137 mmol/L (ref 135–145)

## 2016-12-19 MED ORDER — HYDROCODONE-ACETAMINOPHEN 7.5-325 MG PO TABS
1.0000 | ORAL_TABLET | ORAL | Status: DC | PRN
  Administered 2016-12-19 – 2016-12-20 (×3): 2 via ORAL
  Filled 2016-12-19 (×3): qty 2

## 2016-12-19 MED ORDER — FLEET ENEMA 7-19 GM/118ML RE ENEM
1.0000 | ENEMA | Freq: Every day | RECTAL | Status: DC | PRN
  Administered 2016-12-20: 1 via RECTAL
  Filled 2016-12-19: qty 1

## 2016-12-19 MED ORDER — TAMSULOSIN HCL 0.4 MG PO CAPS
0.4000 mg | ORAL_CAPSULE | Freq: Every day | ORAL | Status: DC
  Administered 2016-12-19 – 2016-12-20 (×2): 0.4 mg via ORAL
  Filled 2016-12-19 (×2): qty 1

## 2016-12-19 MED ORDER — HYDROCODONE-ACETAMINOPHEN 5-325 MG PO TABS
1.0000 | ORAL_TABLET | ORAL | Status: DC | PRN
  Administered 2016-12-19 (×4): 2 via ORAL
  Filled 2016-12-19 (×4): qty 2

## 2016-12-19 NOTE — Progress Notes (Signed)
Physical Therapy Treatment Patient Details Name: Gregory Maxwell. MRN: [id number] DOB: [birth year] Today's Date: 12/19/2016    History of Present Illness Pt is a 56 y/o male s/p L4-5 PLIF. PMH includes back surgery and R rotator cuff repair.     PT Comments    Pt progressing well towards physical therapy goals. Was able to perform transfers and ambulation with gross supervision for safety. Pt was able to don/doff brace without assistance. He was educated on resetting strings and wearing schedule. Pt also educated on car transfer, entering a high bed, and general safety with precautions.    Follow Up Recommendations  No PT follow up;Supervision for mobility/OOB     Equipment Recommendations  3in1 (PT)    Recommendations for Other Services       Precautions / Restrictions Precautions Precautions: Back Precaution Booklet Issued: Yes (comment) Precaution Comments: Reviewed back precaution handout with pt.  Required Braces or Orthoses: Spinal Brace Spinal Brace: Lumbar corset;Applied in sitting position Restrictions Weight Bearing Restrictions: No    Mobility  Bed Mobility Overal bed mobility: Needs Assistance Bed Mobility: Rolling;Sidelying to Sit Rolling: Modified independent (Device/Increase time) Sidelying to sit: Supervision       General bed mobility comments: Supervision to ensure log roll technique. Cues for sequencing  Transfers Overall transfer level: Needs assistance Equipment used: None Transfers: Sit to/from Stand Sit to Stand: Supervision         General transfer comment: Close supervision for safety. No assist required.   Ambulation/Gait Ambulation/Gait assistance: Supervision Ambulation Distance (Feet): 250 Feet Assistive device: None Gait Pattern/deviations: Step-through pattern;Decreased stride length Gait velocity: Decreased Gait velocity interpretation: Below normal speed for age/gender General Gait Details: Slow, guarded gait. Pt experiencing  nausea during gait which limited ambulation distance. Pt also reporting some dizziness. Reports improvement of symptoms with seated rest. Educated about generalized walking program to perform at home.    Stairs Stairs: Yes   Stair Management: One rail Left;Step to pattern;Forwards;Sideways Number of Stairs: 6 General stair comments: VC's for sequencing and general safety. Pt forward facing ascending, and sideways for descending.   Wheelchair Mobility    Modified Rankin (Stroke Patients Only)       Balance Overall balance assessment: Needs assistance Sitting-balance support: No upper extremity supported;Feet supported Sitting balance-Leahy Scale: Good     Standing balance support: Single extremity supported;During functional activity Standing balance-Leahy Scale: Fair                              Cognition Arousal/Alertness: Awake/alert Behavior During Therapy: WFL for tasks assessed/performed Overall Cognitive Status: Within Functional Limits for tasks assessed                                        Exercises      General Comments        Pertinent Vitals/Pain Pain Assessment: Faces Faces Pain Scale: Hurts little more Pain Location: back  Pain Descriptors / Indicators: Aching;Operative site guarding Pain Intervention(s): Monitored during session    Home Living                      Prior Function            PT Goals (current goals can now be found in the care plan section) Acute Rehab PT Goals Patient Stated Goal: to feel  better  PT Goal Formulation: With patient Time For Goal Achievement: 12/25/16 Potential to Achieve Goals: Good Progress towards PT goals: Progressing toward goals    Frequency    Min 5X/week      PT Plan Current plan remains appropriate    Co-evaluation              AM-PAC PT "6 Clicks" Daily Activity  Outcome Measure  Difficulty turning over in bed (including adjusting bedclothes,  sheets and blankets)?: A Little Difficulty moving from lying on back to sitting on the side of the bed? : A Little Difficulty sitting down on and standing up from a chair with arms (e.g., wheelchair, bedside commode, etc,.)?: Unable Help needed moving to and from a bed to chair (including a wheelchair)?: A Little Help needed walking in hospital room?: A Little Help needed climbing 3-5 steps with a railing? : A Lot 6 Click Score: 15    End of Session Equipment Utilized During Treatment: Gait belt;Back brace Activity Tolerance: Patient tolerated treatment well Patient left: in chair;with call bell/phone within reach;with family/visitor present Nurse Communication: Mobility status PT Visit Diagnosis: Other abnormalities of gait and mobility (R26.89);Pain Pain - part of body: (back )     Time: 7867-6720 PT Time Calculation (min) (ACUTE ONLY): 25 min  Charges:  $Gait Training: 23-37 mins                    G Codes:       Rolinda Roan, PT, DPT Acute Rehabilitation Services Pager: [place] 12/19/2016, 1:44 PM

## 2016-12-19 NOTE — Plan of Care (Signed)
  Completed/Met Activity: Will remain free from falls 12/19/2016 2201 - Completed/Met by Charlena Cross, RN Education: Ability to verbalize activity precautions or restrictions will improve 12/19/2016 2201 - Completed/Met by Pricilla Holm, Tondra Reierson D, RN Knowledge of the prescribed therapeutic regimen will improve 12/19/2016 2201 - Completed/Met by Charlena Cross, RN Physical Regulation: Postoperative complications will be avoided or minimized 12/19/2016 2201 - Completed/Met by Charlena Cross, RN Diagnostic test results will improve 12/19/2016 2201 - Completed/Met by Charlena Cross, RN

## 2016-12-19 NOTE — Progress Notes (Signed)
Patient ID: Gregory Maragh., male   DOB: 02/26/1960, 56 y.o.   MRN: [id number] Subjective: Patient reports back soreness, no leg pain or NTW  Objective: Vital signs in last 24 hours: Temp:  [97 F (36.1 C)-99.9 F (37.7 C)] 98.9 F (37.2 C) (12/06 0747) Pulse Rate:  [74-106] 93 (12/06 0747) Resp:  [12-20] 18 (12/06 0747) BP: (106-136)/(59-86) 109/71 (12/06 0747) SpO2:  [91 %-100 %] 99 % (12/06 0747)  Intake/Output from previous day: 12/05 0701 - 12/06 0700 In: 2670 [P.O.:720; I.V.:1700; IV Piggyback:250] Out: 2385 [Urine:2085; Blood:300] Intake/Output this shift: No intake/output data recorded.  Neurologic: Grossly normal  Lab Results: Lab Results  Component Value Date   WBC 9.3 12/19/2016   HGB 11.5 (L) 12/19/2016   HCT 34.7 (L) 12/19/2016   MCV 82.4 12/19/2016   PLT 187 12/19/2016   No results found for: INR, PROTIME BMET Lab Results  Component Value Date   NA 137 12/19/2016   K 3.9 12/19/2016   CL 103 12/19/2016   CO2 27 12/19/2016   GLUCOSE 150 (H) 12/19/2016   BUN 11 12/19/2016   CREATININE 1.03 12/19/2016   CALCIUM 8.6 (L) 12/19/2016    Studies/Results: Dg Lumbar Spine 2-3 Views  Result Date: 12/18/2016 CLINICAL DATA:  L4-5 PLIF EXAM: DG C-ARM 61-120 MIN; LUMBAR SPINE - 2-3 VIEW COMPARISON:  Intraoperative film from earlier in the day. FLUOROSCOPY TIME:  Fluoroscopy Time:  23 seconds Radiation Exposure Index (if provided by the fluoroscopic device): Not available Number of Acquired Spot Images: 2 FINDINGS: Pedicle screws are noted at L4 and L5 with interbody fusion. Posterior fixation elements are not present at this time. IMPRESSION: L4-5 fusion. Electronically Signed   By: Inez Catalina M.D.   On: 12/18/2016 12:24   Dg Lumbar Spine 1 View  Result Date: 12/18/2016 CLINICAL DATA:  Intraoperative localization for L4-5 PLIF EXAM: LUMBAR SPINE - 1 VIEW COMPARISON:  08/14/2016 FINDINGS: Surgical retractors in instruments are noted at the L4-5 interspace. Mild disc  space narrowing is noted as well as at the L5-S1 interspace. IMPRESSION: Intraoperative localization at L4-5. Electronically Signed   By: Inez Catalina M.D.   On: 12/18/2016 12:23   Dg C-arm 1-60 Min  Result Date: 12/18/2016 CLINICAL DATA:  L4-5 PLIF EXAM: DG C-ARM 61-120 MIN; LUMBAR SPINE - 2-3 VIEW COMPARISON:  Intraoperative film from earlier in the day. FLUOROSCOPY TIME:  Fluoroscopy Time:  23 seconds Radiation Exposure Index (if provided by the fluoroscopic device): Not available Number of Acquired Spot Images: 2 FINDINGS: Pedicle screws are noted at L4 and L5 with interbody fusion. Posterior fixation elements are not present at this time. IMPRESSION: L4-5 fusion. Electronically Signed   By: Inez Catalina M.D.   On: 12/18/2016 12:24    Assessment/Plan: Doing ok, hopefully home tomorrow   LOS: 1 day    JONES,DAVID S 12/19/2016, 9:43 AM

## 2016-12-19 NOTE — Plan of Care (Signed)
  Progressing Safety: Ability to remain free from injury will improve 12/19/2016 0418 - Progressing by Jonnie Finner, RN Activity: Will remain free from falls 12/19/2016 0418 - Progressing by Jonnie Finner, RN Education: Ability to verbalize activity precautions or restrictions will improve 12/19/2016 0418 - Progressing by Jonnie Finner, RN Knowledge of the prescribed therapeutic regimen will improve 12/19/2016 0418 - Progressing by Jonnie Finner, RN Understanding of discharge needs will improve 12/19/2016 0418 - Progressing by Jonnie Finner, RN Physical Regulation: Ability to maintain clinical measurements within normal limits will improve 12/19/2016 0418 - Progressing by Jonnie Finner, RN Postoperative complications will be avoided or minimized 12/19/2016 0418 - Progressing by Jonnie Finner, RN Diagnostic test results will improve 12/19/2016 0418 - Progressing by Jonnie Finner, RN Pain Management: Pain level will decrease 12/19/2016 0418 - Progressing by Jonnie Finner, RN Skin Integrity: Signs of wound healing will improve 12/19/2016 0418 - Progressing by Jonnie Finner, RN Health Behavior/Discharge Planning: Identification of resources available to assist in meeting health care needs will improve 12/19/2016 0418 - Progressing by Jonnie Finner, RN

## 2016-12-19 NOTE — Anesthesia Postprocedure Evaluation (Signed)
Anesthesia Post Note  Patient: Gregory Maxwell.  Procedure(s) Performed: POSTERIOR LUMBAR INTERBODY FUSION, INTERBODY PROSTHESIS, POSTERIOR INSTRUMENTATION LUMBAR FOUR- LUMBAR FIVE (N/A Spine Lumbar)     Patient location during evaluation: PACU Anesthesia Type: General Level of consciousness: awake and alert Pain management: pain level controlled Vital Signs Assessment: post-procedure vital signs reviewed and stable Respiratory status: spontaneous breathing, nonlabored ventilation, respiratory function stable and patient connected to nasal cannula oxygen Cardiovascular status: blood pressure returned to baseline and stable Postop Assessment: no apparent nausea or vomiting Anesthetic complications: no    Last Vitals:  Vitals:   12/19/16 0343 12/19/16 0747  BP: 108/68 109/71  Pulse:  93  Resp: 20 18  Temp: 37.7 C 37.2 C  SpO2: 98% 99%    Last Pain:  Vitals:   12/19/16 0650  TempSrc:   PainSc: [place]

## 2016-12-20 MED ORDER — CYCLOBENZAPRINE HCL 10 MG PO TABS: 10.0000 mg | ORAL_TABLET | Freq: Three times a day (TID) | ORAL | 0 refills | Status: DC | PRN

## 2016-12-20 MED ORDER — HYDROCODONE-ACETAMINOPHEN 7.5-325 MG PO TABS: 1.0000 | ORAL_TABLET | ORAL | 0 refills | Status: DC | PRN

## 2016-12-20 NOTE — Discharge Summary (Signed)
Physician Discharge Summary  Patient ID: Gregory Maxwell. MRN: [id number] DOB/AGE: 58-27-1962 58 y.o.  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 The patient tolerated the procedure well and was taken to the recovery room and then to the floor in stable condition. The hospital course was routine. There were no complications. The wound remained clean dry and intact. Pt had appropriate back soreness. No complaints of new pain or new N/T/W. The patient remained afebrile with stable vital signs, and tolerated a regular diet. The patient continued to increase activities, and pain was well controlled with oral pain medications.   Consults: None  Significant Diagnostic Studies:  Results for orders placed or performed during the hospital encounter of 12/18/16  Surgical pcr screen  Result Value Ref Range   MRSA, PCR NEGATIVE NEGATIVE   Staphylococcus aureus NEGATIVE NEGATIVE  CBC  Result Value Ref Range   WBC 3.7 (L) 4.0 - 10.5 K/uL   RBC 5.19 4.22 - 5.81 MIL/uL   Hemoglobin 14.6 13.0 - 17.0 g/dL   HCT 42.0 39.0 - 52.0 %   MCV 80.9 78.0 - 100.0 fL   MCH 28.1 26.0 - 34.0 pg   MCHC 34.8 30.0 - 36.0 g/dL   RDW 13.0 11.5 - 15.5 %   Platelets 189 150 - 400 K/uL  Comprehensive metabolic panel  Result Value Ref Range   Sodium 137 135 - 145 mmol/L   Potassium 3.5 3.5 - 5.1 mmol/L   Chloride 103 101 - 111 mmol/L   CO2 24 22 - 32 mmol/L   Glucose, Bld 116 (H) 65 - 99 mg/dL   BUN 9 6 - 20 mg/dL   Creatinine, Ser 0.88 0.61 - 1.24 mg/dL   Calcium 8.9 8.9 - 10.3 mg/dL   Total Protein 7.2 6.5 - 8.1 g/dL   Albumin 3.7 3.5 - 5.0 g/dL   AST 24 15 - 41 U/L   ALT 25 17 - 63 U/L   Alkaline  Phosphatase 111 38 - 126 U/L   Total Bilirubin 1.0 0.3 - 1.2 mg/dL   GFR calc non Af Amer >60 >60 mL/min   GFR calc Af Amer >60 >60 mL/min   Anion gap 10 5 - 15  CBC  Result Value Ref Range   WBC 9.3 4.0 - 10.5 K/uL   RBC 4.21 (L) 4.22 - 5.81 MIL/uL   Hemoglobin 11.5 (L) 13.0 - 17.0 g/dL   HCT 34.7 (L) 39.0 - 52.0 %   MCV 82.4 78.0 - 100.0 fL   MCH 27.3 26.0 - 34.0 pg   MCHC 33.1 30.0 - 36.0 g/dL   RDW 13.5 11.5 - 15.5 %   Platelets 187 150 - 400 K/uL  Basic Metabolic Panel  Result Value Ref Range   Sodium 137 135 - 145 mmol/L   Potassium 3.9 3.5 - 5.1 mmol/L   Chloride 103 101 - 111 mmol/L   CO2 27 22 - 32 mmol/L   Glucose, Bld 150 (H) 65 - 99 mg/dL   BUN 11 6 - 20 mg/dL   Creatinine, Ser 1.03 0.61 - 1.24 mg/dL   Calcium 8.6 (L) 8.9 - 10.3 mg/dL   GFR calc non Af Amer >60 >60 mL/min   GFR calc Af Amer >60 >60 mL/min  Anion gap 7 5 - 15  Type and screen  Result Value Ref Range   ABO/RH(D) O POS    Antibody Screen NEG    Sample Expiration 12/21/2016   ABO/Rh  Result Value Ref Range   ABO/RH(D) O POS     Dg Lumbar Spine 2-3 Views  Result Date: 12/18/2016 CLINICAL DATA:  L4-5 PLIF EXAM: DG C-ARM 61-120 MIN; LUMBAR SPINE - 2-3 VIEW COMPARISON:  Intraoperative film from earlier in the day. FLUOROSCOPY TIME:  Fluoroscopy Time:  23 seconds Radiation Exposure Index (if provided by the fluoroscopic device): Not available Number of Acquired Spot Images: 2 FINDINGS: Pedicle screws are noted at L4 and L5 with interbody fusion. Posterior fixation elements are not present at this time. IMPRESSION: L4-5 fusion. Electronically Signed   By: Inez Catalina M.D.   On: 12/18/2016 12:24   Dg Lumbar Spine 1 View  Result Date: 12/18/2016 CLINICAL DATA:  Intraoperative localization for L4-5 PLIF EXAM: LUMBAR SPINE - 1 VIEW COMPARISON:  08/14/2016 FINDINGS: Surgical retractors in instruments are noted at the L4-5 interspace. Mild disc space narrowing is noted as well as at the L5-S1 interspace.  IMPRESSION: Intraoperative localization at L4-5. Electronically Signed   By: Inez Catalina M.D.   On: 12/18/2016 12:23   Dg C-arm 1-60 Min  Result Date: 12/18/2016 CLINICAL DATA:  L4-5 PLIF EXAM: DG C-ARM 61-120 MIN; LUMBAR SPINE - 2-3 VIEW COMPARISON:  Intraoperative film from earlier in the day. FLUOROSCOPY TIME:  Fluoroscopy Time:  23 seconds Radiation Exposure Index (if provided by the fluoroscopic device): Not available Number of Acquired Spot Images: 2 FINDINGS: Pedicle screws are noted at L4 and L5 with interbody fusion. Posterior fixation elements are not present at this time. IMPRESSION: L4-5 fusion. Electronically Signed   By: Inez Catalina M.D.   On: 12/18/2016 12:24    Antibiotics:  Anti-infectives (From admission, onward)   Start     Dose/Rate Route Frequency Ordered Stop   12/18/16 1700  ceFAZolin (ANCEF) IVPB 2g/100 mL premix     2 g 200 mL/hr over 30 Minutes Intravenous Every 8 hours 12/18/16 1451 12/19/16 0001   12/18/16 1204  vancomycin (VANCOCIN) powder  Status:  Discontinued       As needed 12/18/16 1204 12/18/16 1230   12/18/16 0954  bacitracin 50,000 Units in sodium chloride irrigation 0.9 % 500 mL irrigation  Status:  Discontinued       As needed 12/18/16 0955 12/18/16 1230   12/18/16 0630  ceFAZolin (ANCEF) IVPB 2g/100 mL premix     2 g 200 mL/hr over 30 Minutes Intravenous On call to O.R. 12/18/16 0617 12/18/16 0849      Discharge Exam: Blood pressure 125/63, pulse 90, temperature 99.6 F (37.6 C), resp. rate 16, height 5' 5.5" (1.664 m), weight 80.3 kg (177 lb), SpO2 95 %. Neurologic: Grossly normal Ambulating and voiding well.   Discharge Medications:   Allergies as of 12/20/2016   No Known Allergies     Medication List    TAKE these medications   amoxicillin 875 MG tablet Commonly known as:  AMOXIL Take 875 mg by mouth 2 (two) times daily.   cyclobenzaprine 10 MG tablet Commonly known as:  FLEXERIL Take 1 tablet (10 mg total) by mouth 3 (three)  times daily as needed for muscle spasms.   ezetimibe 10 MG tablet Commonly known as:  ZETIA Take 10 mg by mouth daily.   HYDROcodone-acetaminophen 7.5-325 MG tablet Commonly known as:  NORCO Take 1-2 tablets  by mouth every 4 (four) hours as needed for moderate pain.   ranitidine 150 MG tablet Commonly known as:  ZANTAC Take 150 mg by mouth daily as needed for heartburn.            Durable Medical Equipment  (From admission, onward)        Start     Ordered   12/19/16 1221  For home use only DME 3 n 1  Once     12/19/16 1220      Disposition: home   Final Dx: same as admitting  Discharge Instructions     Remove dressing in 72 hours   Complete by:  As directed    Call MD for:  difficulty breathing, headache or visual disturbances   Complete by:  As directed    Call MD for:  extreme fatigue   Complete by:  As directed    Call MD for:  hives   Complete by:  As directed    Call MD for:  persistant dizziness or light-headedness   Complete by:  As directed    Call MD for:  persistant nausea and vomiting   Complete by:  As directed    Call MD for:  redness, tenderness, or signs of infection (pain, swelling, redness, odor or green/yellow discharge around incision site)   Complete by:  As directed    Call MD for:  severe uncontrolled pain   Complete by:  As directed    Call MD for:  temperature >100.4   Complete by:  As directed    Diet - low sodium heart healthy   Complete by:  As directed    Driving Restrictions   Complete by:  As directed    No driivng 2 weeks   Increase activity slowly   Complete by:  As directed    Lifting restrictions   Complete by:  As directed    No lifting more than 8 lbs         Signed: Ocie Cornfield Emmarie Maxwell 12/20/2016, 9:06 AM

## 2016-12-20 NOTE — Progress Notes (Signed)
Patient alert and oriented, mae's well, voiding adequate amount of urine, swallowing without difficulty,  c/o pain at time of discharge and medication given prior to discharge. Patient discharged home with family. Script and discharged instructions given to patient. Patient and family stated understanding of instructions given. Patient has an appointment with Dr. Arnoldo Morale

## 2016-12-20 NOTE — Discharge Instructions (Signed)
Wound Care Keep incision covered and dry for 3 days    You may remove outer bandage after 3 days. Do not put any creams, lotions, or ointments on incision. Leave steri-strips on back.  They will fall off by themselves. Activity Walk each and every day, increasing distance each day. No lifting greater than 8 lbs.  Avoid bending, lifting and twisting No driving for 2 weeks; may ride as a passenger locally.  Diet Resume your normal soft diet.  Return to Work Will be discussed at you follow up appointment. Call Your Doctor If Any of These Occur Redness, drainage, or swelling at the wound.  Temperature greater than 101 degrees. Severe pain not relieved by pain medication. Increased difficulty swallowing.  Incision starts to come apart. Follow Up Appt Call today for appointment in 3 weeks ([phone]) or for problems.  If you have any hardware placed in your spine, you will need an x-ray before your appointment.

## 2016-12-20 NOTE — Progress Notes (Signed)
Physical Therapy Treatment Patient Details Name: Gregory Maxwell. MRN: [id number] DOB: 1960/02/10 Today's Date: 12/20/2016    History of Present Illness Pt is a 56 y/o male s/p L4-5 PLIF. PMH includes back surgery and R rotator cuff repair.    PT Comments    Pt has progressed well with mobility. Indep with brace application and able to recall 3/3 precautions. Amb 400' independently and steps with one rail. Has met short-term acute PT goals. All education completed and questions answered. Will d/c acute PT.   Follow Up Recommendations  No PT follow up;Supervision for mobility/OOB     Equipment Recommendations  3in1 (PT)    Recommendations for Other Services       Precautions / Restrictions Precautions Precautions: Back Precaution Comments: Able to recall 3/3 precautions Required Braces or Orthoses: Spinal Brace Spinal Brace: Lumbar corset;Applied in sitting position Restrictions Weight Bearing Restrictions: No    Mobility  Bed Mobility               General bed mobility comments: Received sitting EOB. Reports indep with log roll technique  Transfers Overall transfer level: Independent Equipment used: None Transfers: Sit to/from Stand              Ambulation/Gait Ambulation/Gait assistance: Independent Ambulation Distance (Feet): 400 Feet Assistive device: None Gait Pattern/deviations: Step-through pattern;Decreased stride length Gait velocity: Decreased Gait velocity interpretation: <1.8 ft/sec, indicative of risk for recurrent falls General Gait Details: Slow, controlled amb with no AD. Educ on walking program and continued activity at home   Stairs Stairs: Yes   Stair Management: One rail Left;Step to pattern;Forwards Number of Stairs: 6 General stair comments: Mod indep with use of single rail. Good recall of technique  Wheelchair Mobility    Modified Rankin (Stroke Patients Only)       Balance Overall balance assessment: Needs  assistance Sitting-balance support: No upper extremity supported;Feet supported Sitting balance-Leahy Scale: Good     Standing balance support: No upper extremity supported;During functional activity Standing balance-Leahy Scale: Good                              Cognition Arousal/Alertness: Awake/alert Behavior During Therapy: WFL for tasks assessed/performed Overall Cognitive Status: Within Functional Limits for tasks assessed                                        Exercises      General Comments        Pertinent Vitals/Pain Pain Assessment: 0-10 Pain Score: 6  Pain Location: back  Pain Descriptors / Indicators: Aching;Operative site guarding Pain Intervention(s): Monitored during session    Home Living                      Prior Function            PT Goals (current goals can now be found in the care plan section) Progress towards PT goals: Goals met/education completed, patient discharged from PT    Frequency    Min 5X/week      PT Plan Current plan remains appropriate    Co-evaluation              AM-PAC PT "6 Clicks" Daily Activity  Outcome Measure  Difficulty turning over in bed (including adjusting bedclothes, sheets and blankets)?: None Difficulty moving from  lying on back to sitting on the side of the bed? : None Difficulty sitting down on and standing up from a chair with arms (e.g., wheelchair, bedside commode, etc,.)?: None Help needed moving to and from a bed to chair (including a wheelchair)?: None Help needed walking in hospital room?: None Help needed climbing 3-5 steps with a railing? : None 6 Click Score: 24    End of Session Equipment Utilized During Treatment: Gait belt;Back brace Activity Tolerance: Patient tolerated treatment well Patient left: with call bell/phone within reach;with family/visitor present Nurse Communication: Mobility status PT Visit Diagnosis: Other abnormalities of  gait and mobility (R26.89);Pain Pain - part of body: (back)     Time: 0722-5750 PT Time Calculation (min) (ACUTE ONLY): 13 min  Charges:  $Gait Training: 8-22 mins                    G Codes:      Mabeline Caras, PT, DPT Acute Rehab Services  Pager: [place] 12/20/2016, 9:16 AM

## 2018-12-17 IMAGING — CR DG LUMBAR SPINE 1V
1 series · 1 of 1 positions shown · non-contrast
Comparison: 08/14/2016

CLINICAL DATA: Intraoperative localization for L4-5 PLIF

EXAM:
LUMBAR SPINE - 1 VIEW

[lateral]
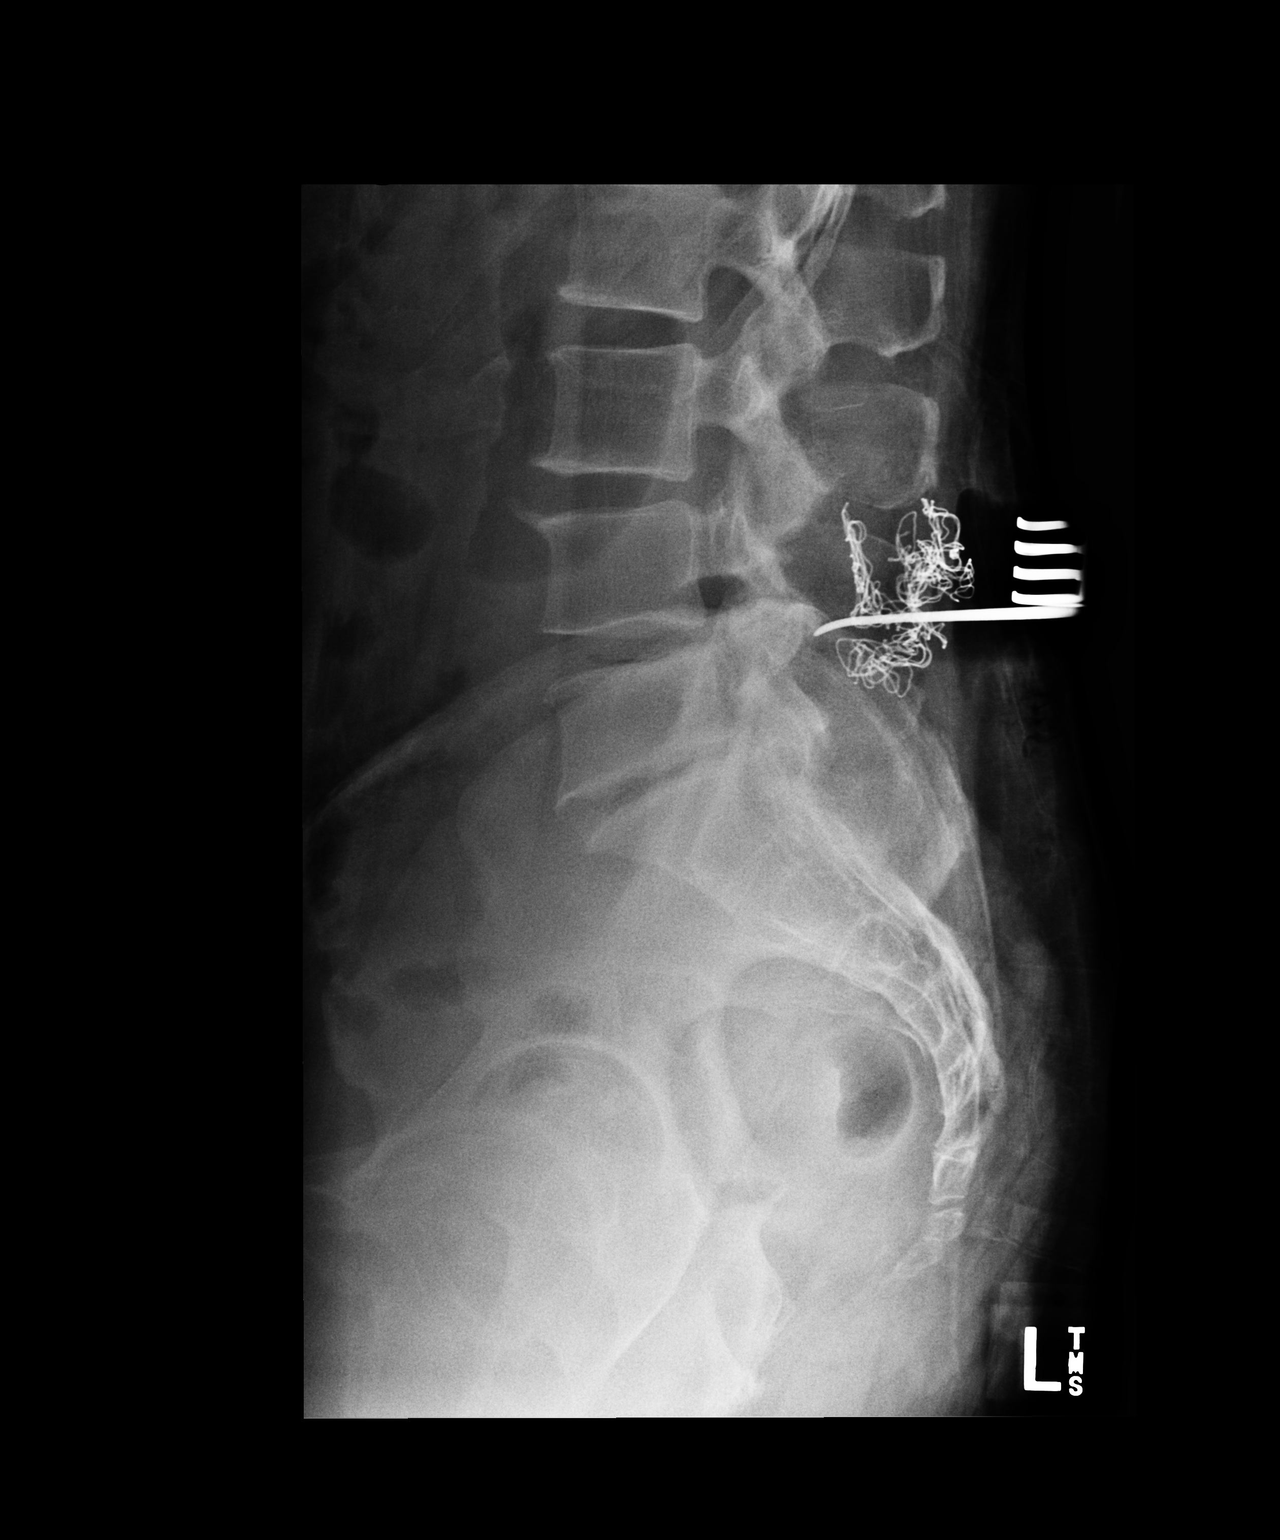

[1 of 1 positions shown; findings below may reference images not displayed]

FINDINGS: Surgical retractors in instruments are noted at the L4-5 interspace.
Mild disc space narrowing is noted as well as at the L5-S1
interspace.
IMPRESSION: Intraoperative localization at L4-5.

## 2019-05-05 ENCOUNTER — Encounter (INDEPENDENT_AMBULATORY_CARE_PROVIDER_SITE_OTHER): Payer: Self-pay | Admitting: *Deleted

## 2019-07-15 ENCOUNTER — Other Ambulatory Visit (INDEPENDENT_AMBULATORY_CARE_PROVIDER_SITE_OTHER): Payer: Self-pay | Admitting: *Deleted

## 2019-07-15 DIAGNOSIS — Z1211 Encounter for screening for malignant neoplasm of colon: Secondary | ICD-10-CM

## 2019-07-16 ENCOUNTER — Encounter (INDEPENDENT_AMBULATORY_CARE_PROVIDER_SITE_OTHER): Payer: Self-pay | Admitting: *Deleted

## 2019-07-16 ENCOUNTER — Telehealth (INDEPENDENT_AMBULATORY_CARE_PROVIDER_SITE_OTHER): Payer: Self-pay | Admitting: *Deleted

## 2019-07-16 MED ORDER — PEG 3350-KCL-NA BICARB-NACL 420 G PO SOLR: 4000.0000 mL | Freq: Once | ORAL | 0 refills | Status: AC

## 2019-07-16 NOTE — Telephone Encounter (Signed)
Referring MD/PCP: burdine   Procedure: tcs  Reason/Indication:  screening  Has patient had this procedure before?  Yes, pver 10 yrs ago  If so, when, by whom and where?    Is there a family history of colon cancer?  no  Who?  What age when diagnosed?    Is patient diabetic?   no      Does patient have prosthetic heart valve or mechanical valve?  no  Do you have a pacemaker/defibrillator?  no  Has patient ever had endocarditis/atrial fibrillation? no  Does patient use oxygen? no  Has patient had joint replacement within last 12 months?  no  Is patient constipated or do they take laxatives? no  Does patient have a history of alcohol/drug use?  no  Is patient on blood thinner such as Coumadin, Plavix and/or Aspirin? no  Medications: rosuvastatin 5 mg daily  Allergies: niacin  Medication Adjustment per Dr Rehman/Dr Jenetta Downer   Procedure date & time: 09/09/19

## 2019-07-16 NOTE — Telephone Encounter (Signed)
Patient needs trilyte 

## 2019-08-02 ENCOUNTER — Telehealth (INDEPENDENT_AMBULATORY_CARE_PROVIDER_SITE_OTHER): Payer: Self-pay | Admitting: *Deleted

## 2019-08-02 ENCOUNTER — Encounter (INDEPENDENT_AMBULATORY_CARE_PROVIDER_SITE_OTHER): Payer: Self-pay | Admitting: *Deleted

## 2019-08-02 MED ORDER — PLENVU 140 G PO SOLR
1.0000 | Freq: Once | ORAL | 0 refills | Status: AC
Start: 2019-08-02 — End: 2019-08-02

## 2019-08-02 NOTE — Telephone Encounter (Signed)
Patient needs Plenvu (copay card) ° °

## 2019-08-24 ENCOUNTER — Other Ambulatory Visit (INDEPENDENT_AMBULATORY_CARE_PROVIDER_SITE_OTHER): Payer: Self-pay | Admitting: *Deleted

## 2019-09-07 ENCOUNTER — Other Ambulatory Visit (HOSPITAL_COMMUNITY)
Admission: RE | Admit: 2019-09-07 | Discharge: 2019-09-07 | Disposition: A | Discharge status: Home / Self Care | Payer: Medicaid Other | Source: Ambulatory Visit | Attending: Internal Medicine | Admitting: Internal Medicine

## 2019-09-07 ENCOUNTER — Other Ambulatory Visit: Payer: Self-pay

## 2019-09-07 DIAGNOSIS — Z20822 Contact with and (suspected) exposure to covid-19: Secondary | ICD-10-CM | POA: Insufficient documentation

## 2019-09-07 DIAGNOSIS — Z01812 Encounter for preprocedural laboratory examination: Secondary | ICD-10-CM | POA: Insufficient documentation

## 2019-09-08 LAB — SARS CORONAVIRUS 2 (TAT 6-24 HRS): SARS Coronavirus 2: NEGATIVE

## 2019-09-09 ENCOUNTER — Encounter (HOSPITAL_COMMUNITY): Payer: Self-pay | Admitting: Internal Medicine

## 2019-09-09 ENCOUNTER — Other Ambulatory Visit: Payer: Self-pay

## 2019-09-09 ENCOUNTER — Encounter (HOSPITAL_COMMUNITY)
Admission: RE | Disposition: A | Discharge status: Home / Self Care | Payer: Self-pay | Source: Home / Self Care | Attending: Internal Medicine

## 2019-09-09 ENCOUNTER — Ambulatory Visit (HOSPITAL_COMMUNITY)
Admission: RE | Admit: 2019-09-09 | Discharge: 2019-09-09 | Disposition: A | Discharge status: Home / Self Care | Payer: Medicaid Other | Attending: Internal Medicine | Admitting: Internal Medicine

## 2019-09-09 DIAGNOSIS — E7800 Pure hypercholesterolemia, unspecified: Secondary | ICD-10-CM | POA: Diagnosis not present

## 2019-09-09 DIAGNOSIS — D120 Benign neoplasm of cecum: Secondary | ICD-10-CM | POA: Insufficient documentation

## 2019-09-09 DIAGNOSIS — Z79899 Other long term (current) drug therapy: Secondary | ICD-10-CM | POA: Diagnosis not present

## 2019-09-09 DIAGNOSIS — K219 Gastro-esophageal reflux disease without esophagitis: Secondary | ICD-10-CM | POA: Insufficient documentation

## 2019-09-09 DIAGNOSIS — K648 Other hemorrhoids: Secondary | ICD-10-CM | POA: Insufficient documentation

## 2019-09-09 DIAGNOSIS — Z1211 Encounter for screening for malignant neoplasm of colon: Secondary | ICD-10-CM | POA: Insufficient documentation

## 2019-09-09 DIAGNOSIS — D123 Benign neoplasm of transverse colon: Secondary | ICD-10-CM | POA: Insufficient documentation

## 2019-09-09 HISTORY — PX: POLYPECTOMY: SHX5525

## 2019-09-09 HISTORY — PX: COLONOSCOPY: SHX5424

## 2019-09-09 SURGERY — COLONOSCOPY
Anesthesia: Moderate Sedation

## 2019-09-09 MED ORDER — MEPERIDINE HCL 50 MG/ML IJ SOLN
INTRAMUSCULAR | Status: AC
  Filled 2019-09-09: qty 1

## 2019-09-09 MED ORDER — MIDAZOLAM HCL 5 MG/5ML IJ SOLN
INTRAMUSCULAR | Status: DC | PRN
  Administered 2019-09-09 (×3): 2 mg via INTRAVENOUS

## 2019-09-09 MED ORDER — MIDAZOLAM HCL 5 MG/5ML IJ SOLN
INTRAMUSCULAR | Status: AC
  Filled 2019-09-09: qty 10

## 2019-09-09 MED ORDER — SODIUM CHLORIDE 0.9 % IV SOLN: INTRAVENOUS | Status: DC

## 2019-09-09 MED ORDER — STERILE WATER FOR IRRIGATION IR SOLN
Status: DC | PRN
  Administered 2019-09-09: 1.5 mL

## 2019-09-09 MED ORDER — MEPERIDINE HCL 50 MG/ML IJ SOLN
INTRAMUSCULAR | Status: DC | PRN
Start: 2019-09-09 — End: 2019-09-09
  Administered 2019-09-09 (×2): 25 mg via INTRAVENOUS

## 2019-09-09 NOTE — Op Note (Signed)
[facility] Patient Name: Gregory Maxwell Procedure Date: 09/09/2019 10:43 AM MRN: [id number] Date of Birth: [birth year] Attending MD: Hildred Laser , MD CSN: [id number] Age: 59 Admit Type: Outpatient Procedure:                Colonoscopy Indications:              Screening for colorectal malignant neoplasm Providers:                Hildred Laser, MD, Otis Peak B. Sharon Seller, RN, Caprice Kluver, Raphael Gibney, Technician Referring MD:             [facility]. Medicines:                Meperidine 50 mg IV, Midazolam 6 mg IV Complications:            No immediate complications. Estimated Blood Loss:     Estimated blood loss was minimal. Procedure:                Pre-Anesthesia Assessment:                           - Prior to the procedure, a History and Physical                            was performed, and patient medications and                            allergies were reviewed. The patient's tolerance of                            previous anesthesia was also reviewed. The risks                            and benefits of the procedure and the sedation                            options and risks were discussed with the patient.                            All questions were answered, and informed consent                            was obtained. Prior Anticoagulants: The patient has                            taken no previous anticoagulant or antiplatelet                            agents except for NSAID medication. ASA Grade                            Assessment: II - A patient with mild systemic  disease. After reviewing the risks and benefits,                            the patient was deemed in satisfactory condition to                            undergo the procedure.                           After obtaining informed consent, the colonoscope                            was passed under direct vision. Throughout the                             procedure, the patient's blood pressure, pulse, and                            oxygen saturations were monitored continuously. The                            PCF-[id number] ([id number]) scope was introduced through                            the anus and advanced to the the cecum, identified                            by appendiceal orifice and ileocecal valve. The                            colonoscopy was performed without difficulty. The                            patient tolerated the procedure well. The quality                            of the bowel preparation was excellent. The                            ileocecal valve, appendiceal orifice, and rectum                            were photographed. Scope In: 11:03:41 AM Scope Out: 11:25:28 AM Scope Withdrawal Time: 0 hours 17 minutes 59 seconds  Total Procedure Duration: 0 hours 21 minutes 47 seconds  Findings:      The perianal and digital rectal examinations were normal.      Two polyps were found in the cecum. The polyps were 5 to 6 mm in size.       These polyps were removed with a cold snare. Resection and retrieval       were complete. The pathology specimen was placed into Bottle Number 1.      Two sessile polyps were found in the splenic flexure. The polyps were       diminutive in size. These were biopsied with a  cold forceps for       histology. The pathology specimen was placed into Bottle Number 2.      The exam was otherwise normal throughout the examined colon.      Internal hemorrhoids were found during retroflexion. The hemorrhoids       were medium-sized. Impression:               - Two 5 to 6 mm polyps in the cecum, removed with a                            cold snare. Resected and retrieved.                           - Two diminutive polyps at the splenic flexure.                            Biopsied.                           - Internal hemorrhoids. Moderate Sedation:      Moderate (conscious)  sedation was administered by the endoscopy nurse       and supervised by the endoscopist. The following parameters were       monitored: oxygen saturation, heart rate, blood pressure, CO2       capnography and response to care. Total physician intraservice time was       27 minutes. Recommendation:           - Patient has a contact number available for                            emergencies. The signs and symptoms of potential                            delayed complications were discussed with the                            patient. Return to normal activities tomorrow.                            Written discharge instructions were provided to the                            patient.                           - Resume previous diet today.                           - Continue present medications.                           - No aspirin, ibuprofen, naproxen, or other                            non-steroidal anti-inflammatory drugs for 1 day.                           -  Await pathology results.                           - Repeat colonoscopy is recommended. The                            colonoscopy date will be determined after pathology                            results from today's exam become available for                            review. Procedure Code(s):        --- Professional ---                           [phone], Colonoscopy, flexible; with removal of                            tumor(s), polyp(s), or other lesion(s) by snare                            technique                           45380, 59, Colonoscopy, flexible; with biopsy,                            single or multiple                           99153, Moderate sedation; each additional 15                            minutes intraservice time                           G0500, Moderate sedation services provided by the                            same physician or other qualified health care                             professional performing a gastrointestinal                            endoscopic service that sedation supports,                            requiring the presence of an independent trained                            observer to assist in the monitoring of the                            patient's level of consciousness and physiological  status; initial 15 minutes of intra-service time;                            patient age 48 years or older (additional time may                            be reported with [phone], as appropriate) Diagnosis Code(s):        --- Professional ---                           Z12.11, Encounter for screening for malignant                            neoplasm of colon                           K63.5, Polyp of colon                           K64.8, Other hemorrhoids CPT copyright 2019 American Medical Association. All rights reserved. The codes documented in this report are preliminary and upon coder review may  be revised to meet current compliance requirements. Hildred Laser, MD Hildred Laser, MD 09/09/2019 11:34:45 AM This report has been signed electronically. Number of Addenda: 0

## 2019-09-09 NOTE — H&P (Signed)
Gregory Clark Cuff. is an 59 y.o. male.   Chief Complaint: Patient is here for colonoscopy. HPI: Patient is 59 year old Afro-American male who is here for screening colonoscopy.  Last exam 10 years ago was normal.  He denies abdominal pain change in bowel habits or rectal bleeding.  He has chronic back pain.  He does not take naproxen daily. History is negative for CRC.  Past Medical History:  Diagnosis Date  . Disc displacement, lumbar   . GERD (gastroesophageal reflux disease)   . Hypercholesterolemia   . PONV (postoperative nausea and vomiting)    problems waking up; problems voiding post-op    Past Surgical History:  Procedure Laterality Date  . BACK SURGERY     total of 2 back surgeries  . COLONOSCOPY    . ROTATOR CUFF REPAIR     right shoulder    Family History  Problem Relation Age of Onset  . Hypercholesterolemia Mother   . Congestive Heart Failure Mother   . Renal Disease Sister    Social History:  reports that he has never smoked. He has never used smokeless tobacco. He reports that he does not drink alcohol and does not use drugs.  Allergies: No Known Allergies  Medications Prior to Admission  Medication Sig Dispense Refill  . diclofenac Sodium (VOLTAREN) 1 % GEL Apply 2 g topically 4 (four) times daily as needed (pain.).     Marland Kitchen Multiple Vitamin (MULTIVITAMIN WITH MINERALS) TABS tablet Take 1 tablet by mouth daily with lunch. Senior Men's Multivitamin    . naproxen (NAPROSYN) 500 MG tablet Take 500 mg by mouth 2 (two) times daily as needed (pain.).     Marland Kitchen pantoprazole (PROTONIX) 40 MG tablet Take 40 mg by mouth daily before breakfast.     . rosuvastatin (CRESTOR) 5 MG tablet Take 5 mg by mouth every Monday.       Results for orders placed or performed during the hospital encounter of 09/07/19 (from the past 48 hour(s))  SARS CORONAVIRUS 2 (TAT 6-24 HRS) Nasopharyngeal Nasopharyngeal Swab     Status: None   Collection Time: 09/07/19  2:05 PM   Specimen:  Nasopharyngeal Swab  Result Value Ref Range   SARS Coronavirus 2 NEGATIVE NEGATIVE    Comment: (NOTE) SARS-CoV-2 target nucleic acids are NOT DETECTED.  The SARS-CoV-2 RNA is generally detectable in upper and lower respiratory specimens during the acute phase of infection. Negative results do not preclude SARS-CoV-2 infection, do not rule out co-infections with other pathogens, and should not be used as the sole basis for treatment or other patient management decisions. Negative results must be combined with clinical observations, patient history, and epidemiological information. The expected result is Negative.  Fact Sheet for Patients: SugarRoll.be  Fact Sheet for Healthcare Providers: https://www.woods-mathews.com/  This test is not yet approved or cleared by the [place] FDA and  has been authorized for detection and/or diagnosis of SARS-CoV-2 by FDA under an Emergency Use Authorization (EUA). This EUA will remain  in effect (meaning this test can be used) for the duration of the COVID-19 declaration under Se ction 564(b)(1) of the Act, 21 U.S.C. section 360bbb-3(b)(1), unless the authorization is terminated or revoked sooner.  Performed at [facility], [place] [address] [place], [place] [address]    No results found.  Review of Systems  Blood pressure (!) 152/93, pulse 76, temperature 99.5 F (37.5 C), temperature source Oral, resp. rate 13, height 5' 5.5" (1.664 m), SpO2 99 %. Physical Exam  HENT:     Mouth/Throat:     Mouth: Mucous membranes are moist.     Pharynx: Oropharynx is clear.  Eyes:     General: No scleral icterus.    Conjunctiva/sclera: Conjunctivae normal.  Cardiovascular:     Rate and Rhythm: Normal rate and regular rhythm.     Heart sounds: Normal heart sounds. No murmur heard.   Pulmonary:     Effort: Pulmonary effort is normal.     Breath sounds: Normal breath sounds.  Abdominal:      General: There is no distension.     Palpations: Abdomen is soft.     Tenderness: There is no abdominal tenderness.  Musculoskeletal:        General: No swelling.     Cervical back: Neck supple. No rigidity.  Skin:    General: Skin is warm and dry.  Neurological:     Mental Status: He is alert.      Assessment/Plan Average risk screening colonoscopy.  Hildred Laser, MD 09/09/2019, 10:55 AM

## 2019-09-09 NOTE — Discharge Instructions (Signed)
Colon Polyps  Polyps are tissue growths inside the body. Polyps can grow in many places, including the large intestine (colon). A polyp may be a round bump or a mushroom-shaped growth. You could have one polyp or several. Most colon polyps are noncancerous (benign). However, some colon polyps can become cancerous over time. Finding and removing the polyps early can help prevent this. What are the causes? The exact cause of colon polyps is not known. What increases the risk? You are more likely to develop this condition if you:  Have a family history of colon cancer or colon polyps.  Are older than 55 or older than 45 if you are African American.  Have inflammatory bowel disease, such as ulcerative colitis or Crohn's disease.  Have certain hereditary conditions, such as: ? Familial adenomatous polyposis. ? Lynch syndrome. ? Turcot syndrome. ? Peutz-Jeghers syndrome.  Are overweight.  Smoke cigarettes.  Do not get enough exercise.  Drink too much alcohol.  Eat a diet that is high in fat and red meat and low in fiber.  Had childhood cancer that was treated with abdominal radiation. What are the signs or symptoms? Most polyps do not cause symptoms. If you have symptoms, they may include:  Blood coming from your rectum when having a bowel movement.  Blood in your stool. The stool may look dark red or black.  Abdominal pain.  A change in bowel habits, such as constipation or diarrhea. How is this diagnosed? This condition is diagnosed with a colonoscopy. This is a procedure in which a lighted, flexible scope is inserted into the anus and then passed into the colon to examine the area. Polyps are sometimes found when a colonoscopy is done as part of routine cancer screening tests. How is this treated? Treatment for this condition involves removing any polyps that are found. Most polyps can be removed during a colonoscopy. Those polyps will then be tested for cancer. Additional  treatment may be needed depending on the results of testing. Follow these instructions at home: Lifestyle  Maintain a healthy weight, or lose weight if recommended by your health care provider.  Exercise every day or as told by your health care provider.  Do not use any products that contain nicotine or tobacco, such as cigarettes and e-cigarettes. If you need help quitting, ask your health care provider.  If you drink alcohol, limit how much you have: ? 0-1 drink a day for women. ? 0-2 drinks a day for men.  Be aware of how much alcohol is in your drink. In the [place] one drink equals one 12 oz bottle of beer (355 mL), one 5 oz glass of wine (148 mL), or one 1 oz shot of hard liquor (44 mL). Eating and drinking   Eat foods that are high in fiber, such as fruits, vegetables, and whole grains.  Eat foods that are high in calcium and vitamin D, such as milk, cheese, yogurt, eggs, liver, fish, and broccoli.  Limit foods that are high in fat, such as fried foods and desserts.  Limit the amount of red meat and processed meat you eat, such as hot dogs, sausage, bacon, and lunch meats. General instructions  Keep all follow-up visits as told by your health care provider. This is important. ? This includes having regularly scheduled colonoscopies. ? Talk to your health care provider about when you need a colonoscopy. Contact a health care provider if:  You have new or worsening bleeding during a bowel movement.  You  have new or increased blood in your stool.  You have a change in bowel habits.  You lose weight for no known reason. Summary  Polyps are tissue growths inside the body. Polyps can grow in many places, including the colon.  Most colon polyps are noncancerous (benign), but some can become cancerous over time.  This condition is diagnosed with a colonoscopy.  Treatment for this condition involves removing any polyps that are found. Most polyps can be removed during a  colonoscopy. This information is not intended to replace advice given to you by your health care provider. Make sure you discuss any questions you have with your health care provider. Document Revised: 04/17/2017 Document Reviewed: 04/17/2017 Elsevier Patient Education  [place]. Colonoscopy, Adult, Care After This sheet gives you information about how to care for yourself after your procedure. Your health care provider may also give you more specific instructions. If you have problems or questions, contact your health care provider. What can I expect after the procedure? After the procedure, it is common to have:  A small amount of blood in your stool for 24 hours after the procedure.  Some gas.  Mild cramping or bloating of your abdomen. Follow these instructions at home: Eating and drinking   Drink enough fluid to keep your urine pale yellow.  Follow instructions from your health care provider about eating or drinking restrictions.  Resume your normal diet as instructed by your health care provider. Avoid heavy or fried foods that are hard to digest. Activity  Rest as told by your health care provider.  Avoid sitting for a long time without moving. Get up to take short walks every 1-2 hours. This is important to improve blood flow and breathing. Ask for help if you feel weak or unsteady.  Return to your normal activities as told by your health care provider. Ask your health care provider what activities are safe for you. Managing cramping and bloating   Try walking around when you have cramps or feel bloated.  Apply heat to your abdomen as told by your health care provider. Use the heat source that your health care provider recommends, such as a moist heat pack or a heating pad. ? Place a towel between your skin and the heat source. ? Leave the heat on for 20-30 minutes. ? Remove the heat if your skin turns bright red. This is especially important if you are unable  to feel pain, heat, or cold. You may have a greater risk of getting burned. General instructions  For the first 24 hours after the procedure: ? Do not drive or use machinery. ? Do not sign important documents. ? Do not drink alcohol. ? Do your regular daily activities at a slower pace than normal. ? Eat soft foods that are easy to digest.  Take over-the-counter and prescription medicines only as told by your health care provider.  Keep all follow-up visits as told by your health care provider. This is important. Contact a health care provider if:  You have blood in your stool 2-3 days after the procedure. Get help right away if you have:  More than a small spotting of blood in your stool.  Large blood clots in your stool.  Swelling of your abdomen.  Nausea or vomiting.  A fever.  Increasing pain in your abdomen that is not relieved with medicine. Summary  After the procedure, it is common to have a small amount of blood in your stool. You may  also have mild cramping and bloating of your abdomen.  For the first 24 hours after the procedure, do not drive or use machinery, sign important documents, or drink alcohol.  Get help right away if you have a lot of blood in your stool, nausea or vomiting, a fever, or increased pain in your abdomen. This information is not intended to replace advice given to you by your health care provider. Make sure you discuss any questions you have with your health care provider. Document Revised: 07/27/2018 Document Reviewed: 07/27/2018 Elsevier Patient Education  [place].    No aspirin or NSAIDs for 24 hours. Resume scheduled medications as before. Resume usual diet. No driving for 24 hours. Physician will call with biopsy results.

## 2019-09-10 LAB — SURGICAL PATHOLOGY

## 2019-09-13 ENCOUNTER — Encounter (HOSPITAL_COMMUNITY): Payer: Self-pay | Admitting: Internal Medicine
